# Patient Record
Sex: Female | Born: 1940 | Race: White | Hispanic: No | Marital: Married | Smoking: Former smoker
Health system: Southern US, Community
[De-identification: ages and names within clinical notes are randomized; demographics above are authoritative.]

## PROBLEM LIST (undated history)

## (undated) DIAGNOSIS — M539 Dorsopathy, unspecified: Secondary | ICD-10-CM

## (undated) DIAGNOSIS — C459 Mesothelioma, unspecified: Secondary | ICD-10-CM

## (undated) DIAGNOSIS — I2699 Other pulmonary embolism without acute cor pulmonale: Secondary | ICD-10-CM

## (undated) DIAGNOSIS — D34 Benign neoplasm of thyroid gland: Secondary | ICD-10-CM

## (undated) DIAGNOSIS — F329 Major depressive disorder, single episode, unspecified: Secondary | ICD-10-CM

## (undated) DIAGNOSIS — Z9221 Personal history of antineoplastic chemotherapy: Secondary | ICD-10-CM

## (undated) DIAGNOSIS — G629 Polyneuropathy, unspecified: Secondary | ICD-10-CM

## (undated) DIAGNOSIS — F32A Depression, unspecified: Secondary | ICD-10-CM

## (undated) DIAGNOSIS — I8290 Acute embolism and thrombosis of unspecified vein: Secondary | ICD-10-CM

## (undated) DIAGNOSIS — K219 Gastro-esophageal reflux disease without esophagitis: Secondary | ICD-10-CM

## (undated) HISTORY — PX: VARICOSE VEIN SURGERY: SHX832

## (undated) HISTORY — PX: LUNG BIOPSY: SHX232

## (undated) HISTORY — PX: PORTACATH PLACEMENT: SHX2246

## (undated) HISTORY — PX: ABDOMINAL HYSTERECTOMY: SHX81

---

## 2011-06-02 ENCOUNTER — Ambulatory Visit: Payer: Self-pay | Admitting: Internal Medicine

## 2012-06-07 ENCOUNTER — Ambulatory Visit: Payer: Self-pay | Admitting: Internal Medicine

## 2013-01-21 ENCOUNTER — Ambulatory Visit: Payer: Self-pay | Admitting: Gastroenterology

## 2013-03-07 DIAGNOSIS — C459 Mesothelioma, unspecified: Secondary | ICD-10-CM

## 2013-03-07 HISTORY — DX: Mesothelioma, unspecified: C45.9

## 2013-06-27 ENCOUNTER — Ambulatory Visit: Payer: Self-pay | Admitting: Internal Medicine

## 2013-11-20 ENCOUNTER — Ambulatory Visit: Payer: Self-pay | Admitting: Internal Medicine

## 2013-11-22 ENCOUNTER — Ambulatory Visit: Payer: Self-pay | Admitting: Specialist

## 2013-11-22 LAB — PROTEIN, BODY FLUID: Protein, Body Fluid: 4.7 g/dL

## 2013-11-22 LAB — BODY FLUID CELL COUNT WITH DIFFERENTIAL
BASOS ABS: 0 %
EOS PCT: 0 %
Lymphocytes: 20 %
Neutrophils: 3 %
Nucleated Cell Count: 3150 /mm3
Other Cells BF: 0 %
Other Mononuclear Cells: 77 %

## 2013-11-22 LAB — GLUCOSE, SEROUS FLUID: GLUCOSE, BODY FLUID: 49 mg/dL

## 2013-11-22 LAB — LACTATE DEHYDROGENASE, PLEURAL OR PERITONEAL FLUID: LDH, BODY FLUID: 308 U/L

## 2013-11-26 LAB — BODY FLUID CULTURE

## 2013-11-29 ENCOUNTER — Ambulatory Visit: Payer: Self-pay | Admitting: Cardiothoracic Surgery

## 2013-11-29 LAB — CBC CANCER CENTER
BASOS PCT: 1 %
Basophil #: 0.1 x10 3/mm (ref 0.0–0.1)
EOS ABS: 0.2 x10 3/mm (ref 0.0–0.7)
Eosinophil %: 2.6 %
HCT: 41 % (ref 35.0–47.0)
HGB: 13.1 g/dL (ref 12.0–16.0)
LYMPHS PCT: 14.3 %
Lymphocyte #: 1.2 x10 3/mm (ref 1.0–3.6)
MCH: 28.3 pg (ref 26.0–34.0)
MCHC: 32.1 g/dL (ref 32.0–36.0)
MCV: 88 fL (ref 80–100)
MONO ABS: 0.6 x10 3/mm (ref 0.2–0.9)
Monocyte %: 7.3 %
Neutrophil #: 6 x10 3/mm (ref 1.4–6.5)
Neutrophil %: 74.8 %
PLATELETS: 583 x10 3/mm — AB (ref 150–440)
RBC: 4.65 10*6/uL (ref 3.80–5.20)
RDW: 14.1 % (ref 11.5–14.5)
WBC: 8.1 x10 3/mm (ref 3.6–11.0)

## 2013-11-29 LAB — COMPREHENSIVE METABOLIC PANEL
ALBUMIN: 2.9 g/dL — AB (ref 3.4–5.0)
ALK PHOS: 73 U/L
ALT: 26 U/L
AST: 14 U/L — AB (ref 15–37)
Anion Gap: 6 — ABNORMAL LOW (ref 7–16)
BUN: 12 mg/dL (ref 7–18)
Bilirubin,Total: 0.3 mg/dL (ref 0.2–1.0)
CALCIUM: 8.7 mg/dL (ref 8.5–10.1)
Chloride: 100 mmol/L (ref 98–107)
Co2: 30 mmol/L (ref 21–32)
Creatinine: 0.75 mg/dL (ref 0.60–1.30)
EGFR (African American): >60
EGFR (Non-African Amer.): >60
GLUCOSE: 100 mg/dL — AB (ref 65–99)
Osmolality: 272 (ref 275–301)
Potassium: 4.1 mmol/L (ref 3.5–5.1)
SODIUM: 136 mmol/L (ref 136–145)
TOTAL PROTEIN: 6.7 g/dL (ref 6.4–8.2)

## 2013-11-29 LAB — PROTIME-INR
INR: 1
Prothrombin Time: 13.2 secs (ref 11.5–14.7)

## 2013-11-29 LAB — APTT: Activated PTT: 33.8 secs (ref 23.6–35.9)

## 2013-12-05 ENCOUNTER — Ambulatory Visit: Payer: Self-pay | Admitting: Cardiothoracic Surgery

## 2013-12-13 LAB — CULTURE, FUNGUS WITHOUT SMEAR

## 2014-07-01 ENCOUNTER — Ambulatory Visit
Admit: 2014-07-01 | Disposition: A | Discharge status: Home / Self Care | Payer: Self-pay | Attending: Internal Medicine | Admitting: Internal Medicine

## 2016-04-07 ENCOUNTER — Encounter: Payer: Self-pay | Admitting: *Deleted

## 2016-04-07 NOTE — Discharge Instructions (Signed)
Cataract Surgery, Care After °Refer to this sheet in the next few weeks. These instructions provide you with information about caring for yourself after your procedure. Your health care provider may also give you more specific instructions. Your treatment has been planned according to current medical practices, but problems sometimes occur. Call your health care provider if you have any problems or questions after your procedure. °What can I expect after the procedure? °After the procedure, it is common to have: °· Itching. °· Discomfort. °· Fluid discharge. °· Sensitivity to light and to touch. °· Bruising. °Follow these instructions at home: °Eye Care  °· Check your eye every day for signs of infection. Watch for: °¨ Redness, swelling, or pain. °¨ Fluid, blood, or pus. °¨ Warmth. °¨ Bad smell. °Activity  °· Avoid strenuous activities, such as playing contact sports, for as long as told by your health care provider. °· Do not drive or operate heavy machinery until your health care provider approves. °· Do not bend or lift heavy objects . Bending increases pressure in the eye. You can walk, climb stairs, and do light household chores. °· Ask your health care provider when you can return to work. If you work in a dusty environment, you may be advised to wear protective eyewear for a period of time. °General instructions  °· Take or apply over-the-counter and prescription medicines only as told by your health care provider. This includes eye drops. °· Do not touch or rub your eyes. °· If you were given a protective shield, wear it as told by your health care provider. If you were not given a protective shield, wear sunglasses as told by your health care provider to protect your eyes. °· Keep the area around your eye clean and dry. Avoid swimming or allowing water to hit you directly in the face while showering until told by your health care provider. Keep soap and shampoo out of your eyes. °· Do not put a contact lens  into the affected eye or eyes until your health care provider approves. °· Keep all follow-up visits as told by your health care provider. This is important. °Contact a health care provider if: ° °· You have increased bruising around your eye. °· You have pain that is not helped with medicine. °· You have a fever. °· You have redness, swelling, or pain in your eye. °· You have fluid, blood, or pus coming from your incision. °· Your vision gets worse. °Get help right away if: °· You have sudden vision loss. °This information is not intended to replace advice given to you by your health care provider. Make sure you discuss any questions you have with your health care provider. °Document Released: 09/10/2004 Document Revised: 07/02/2015 Document Reviewed: 01/01/2015 °Elsevier Interactive Patient Education © 2017 [employer] ° ° ° ° °General Anesthesia, Adult, Care After °These instructions provide you with information about caring for yourself after your procedure. Your health care provider may also give you more specific instructions. Your treatment has been planned according to current medical practices, but problems sometimes occur. Call your health care provider if you have any problems or questions after your procedure. °What can I expect after the procedure? °After the procedure, it is common to have: °· Vomiting. °· A sore throat. °· Mental slowness. °It is common to feel: °· Nauseous. °· Cold or shivery. °· Sleepy. °· Tired. °· Sore or achy, even in parts of your body where you did not have surgery. °Follow these instructions at   home: °For at least 24 hours after the procedure:  °· Do not: °¨ Participate in activities where you could fall or become injured. °¨ Drive. °¨ Use heavy machinery. °¨ Drink alcohol. °¨ Take sleeping pills or medicines that cause drowsiness. °¨ Make important decisions or sign legal documents. °¨ Take care of children on your own. °· Rest. °Eating and drinking  °· If you vomit, drink  water, juice, or soup when you can drink without vomiting. °· Drink enough fluid to keep your urine clear or pale yellow. °· Make sure you have little or no nausea before eating solid foods. °· Follow the diet recommended by your health care provider. °General instructions  °· Have a responsible adult stay with you until you are awake and alert. °· Return to your normal activities as told by your health care provider. Ask your health care provider what activities are safe for you. °· Take over-the-counter and prescription medicines only as told by your health care provider. °· If you smoke, do not smoke without supervision. °· Keep all follow-up visits as told by your health care provider. This is important. °Contact a health care provider if: °· You continue to have nausea or vomiting at home, and medicines are not helpful. °· You cannot drink fluids or start eating again. °· You cannot urinate after 8-12 hours. °· You develop a skin rash. °· You have fever. °· You have increasing redness at the site of your procedure. °Get help right away if: °· You have difficulty breathing. °· You have chest pain. °· You have unexpected bleeding. °· You feel that you are having a life-threatening or urgent problem. °This information is not intended to replace advice given to you by your health care provider. Make sure you discuss any questions you have with your health care provider. °Document Released: 05/30/2000 Document Revised: 07/27/2015 Document Reviewed: 02/05/2015 °Elsevier Interactive Patient Education © 2017 [employer] ° °

## 2016-04-13 ENCOUNTER — Ambulatory Visit: Payer: Medicare Other | Admitting: Anesthesiology

## 2016-04-13 ENCOUNTER — Encounter
Admission: RE | Disposition: A | Discharge status: Home / Self Care | Payer: Self-pay | Source: Ambulatory Visit | Attending: Ophthalmology

## 2016-04-13 ENCOUNTER — Ambulatory Visit
Admission: RE | Admit: 2016-04-13 | Discharge: 2016-04-13 | Disposition: A | Discharge status: Home / Self Care | Payer: Medicare Other | Source: Ambulatory Visit | Attending: Ophthalmology | Admitting: Ophthalmology

## 2016-04-13 DIAGNOSIS — Z87891 Personal history of nicotine dependence: Secondary | ICD-10-CM | POA: Insufficient documentation

## 2016-04-13 DIAGNOSIS — H2512 Age-related nuclear cataract, left eye: Secondary | ICD-10-CM | POA: Diagnosis present

## 2016-04-13 DIAGNOSIS — K219 Gastro-esophageal reflux disease without esophagitis: Secondary | ICD-10-CM | POA: Insufficient documentation

## 2016-04-13 HISTORY — PX: CATARACT EXTRACTION W/PHACO: SHX586

## 2016-04-13 HISTORY — DX: Gastro-esophageal reflux disease without esophagitis: K21.9

## 2016-04-13 HISTORY — DX: Benign neoplasm of thyroid gland: D34

## 2016-04-13 HISTORY — DX: Dorsopathy, unspecified: M53.9

## 2016-04-13 HISTORY — DX: Depression, unspecified: F32.A

## 2016-04-13 HISTORY — DX: Mesothelioma, unspecified: C45.9

## 2016-04-13 HISTORY — DX: Acute embolism and thrombosis of unspecified vein: I82.90

## 2016-04-13 HISTORY — DX: Polyneuropathy, unspecified: G62.9

## 2016-04-13 HISTORY — DX: Major depressive disorder, single episode, unspecified: F32.9

## 2016-04-13 SURGERY — PHACOEMULSIFICATION, CATARACT, WITH IOL INSERTION
Anesthesia: Monitor Anesthesia Care | Laterality: Left | Wound class: Clean

## 2016-04-13 MED ORDER — ACETAMINOPHEN 160 MG/5ML PO SOLN: 325.0000 mg | ORAL | Status: DC | PRN

## 2016-04-13 MED ORDER — EPINEPHRINE PF 1 MG/ML IJ SOLN
INTRAMUSCULAR | Status: DC | PRN
  Administered 2016-04-13: 75 mL via OPHTHALMIC

## 2016-04-13 MED ORDER — BRIMONIDINE TARTRATE-TIMOLOL 0.2-0.5 % OP SOLN
OPHTHALMIC | Status: DC | PRN
Start: 2016-04-13 — End: 2016-04-13
  Administered 2016-04-13: 1 [drp] via OPHTHALMIC

## 2016-04-13 MED ORDER — MOXIFLOXACIN HCL 0.5 % OP SOLN
OPHTHALMIC | Status: DC | PRN
  Administered 2016-04-13: 0.2 mL via OPHTHALMIC

## 2016-04-13 MED ORDER — MIDAZOLAM HCL 2 MG/2ML IJ SOLN
INTRAMUSCULAR | Status: DC | PRN
  Administered 2016-04-13: 2 mg via INTRAVENOUS

## 2016-04-13 MED ORDER — NA HYALUR & NA CHOND-NA HYALUR 0.4-0.35 ML IO KIT
PACK | INTRAOCULAR | Status: DC | PRN
  Administered 2016-04-13: 1 mL via INTRAOCULAR

## 2016-04-13 MED ORDER — FENTANYL CITRATE (PF) 100 MCG/2ML IJ SOLN
INTRAMUSCULAR | Status: DC | PRN
  Administered 2016-04-13: 50 ug via INTRAVENOUS

## 2016-04-13 MED ORDER — LACTATED RINGERS IV SOLN: INTRAVENOUS | Status: DC

## 2016-04-13 MED ORDER — ACETAMINOPHEN 325 MG PO TABS: 325.0000 mg | ORAL_TABLET | ORAL | Status: DC | PRN

## 2016-04-13 MED ORDER — LIDOCAINE HCL (PF) 2 % IJ SOLN
INTRAMUSCULAR | Status: DC | PRN
  Administered 2016-04-13: 1 mL

## 2016-04-13 MED ORDER — MOXIFLOXACIN HCL 0.5 % OP SOLN
1.0000 [drp] | OPHTHALMIC | Status: DC | PRN
  Administered 2016-04-13 (×3): 1 [drp] via OPHTHALMIC

## 2016-04-13 MED ORDER — ARMC OPHTHALMIC DILATING DROPS
1.0000 "application " | OPHTHALMIC | Status: DC | PRN
  Administered 2016-04-13 (×3): 1 via OPHTHALMIC

## 2016-04-13 SURGICAL SUPPLY — 25 items
CANNULA ANT/CHMB 27GA (MISCELLANEOUS) ×3 IMPLANT
CARTRIDGE ABBOTT (MISCELLANEOUS) IMPLANT
GLOVE SURG LX 7.5 STRW (GLOVE) ×2
GLOVE SURG LX STRL 7.5 STRW (GLOVE) ×1 IMPLANT
GLOVE SURG TRIUMPH 8.0 PF LTX (GLOVE) ×3 IMPLANT
GOWN STRL REUS W/ TWL LRG LVL3 (GOWN DISPOSABLE) ×2 IMPLANT
GOWN STRL REUS W/TWL LRG LVL3 (GOWN DISPOSABLE) ×4
LENS IOL TECNIS ITEC 21.5 (Intraocular Lens) ×3 IMPLANT
MARKER SKIN DUAL TIP RULER LAB (MISCELLANEOUS) ×3 IMPLANT
NDL RETROBULBAR .5 NSTRL (NEEDLE) IMPLANT
NEEDLE FILTER BLUNT 18X 1/2SAF (NEEDLE) ×2
NEEDLE FILTER BLUNT 18X1 1/2 (NEEDLE) ×1 IMPLANT
PACK CATARACT BRASINGTON (MISCELLANEOUS) ×3 IMPLANT
PACK EYE AFTER SURG (MISCELLANEOUS) ×3 IMPLANT
PACK OPTHALMIC (MISCELLANEOUS) ×3 IMPLANT
RING MALYGIN 7.0 (MISCELLANEOUS) IMPLANT
SUT ETHILON 10-0 CS-B-6CS-B-6 (SUTURE)
SUT VICRYL  9 0 (SUTURE)
SUT VICRYL 9 0 (SUTURE) IMPLANT
SUTURE EHLN 10-0 CS-B-6CS-B-6 (SUTURE) IMPLANT
SYR 3ML LL SCALE MARK (SYRINGE) ×3 IMPLANT
SYR 5ML LL (SYRINGE) ×3 IMPLANT
SYR TB 1ML LUER SLIP (SYRINGE) ×3 IMPLANT
WATER STERILE IRR 250ML POUR (IV SOLUTION) ×3 IMPLANT
WIPE NON LINTING 3.25X3.25 (MISCELLANEOUS) ×3 IMPLANT

## 2016-04-13 NOTE — Anesthesia Preprocedure Evaluation (Signed)
Anesthesia Evaluation  Patient identified by MRN, date of birth, ID band Patient awake    Reviewed: Allergy & Precautions, H&P , NPO status , Patient's Chart, lab work & pertinent test results  Airway Mallampati: II  TM Distance: >3 FB Neck ROM: full    Dental no notable dental hx.    Pulmonary former smoker,    Pulmonary exam normal        Cardiovascular Normal cardiovascular exam     Neuro/Psych PSYCHIATRIC DISORDERS    GI/Hepatic GERD  ,  Endo/Other    Renal/GU      Musculoskeletal   Abdominal   Peds  Hematology   Anesthesia Other Findings   Reproductive/Obstetrics                             Anesthesia Physical Anesthesia Plan  ASA: II  Anesthesia Plan: MAC   Post-op Pain Management:    Induction:   Airway Management Planned:   Additional Equipment:   Intra-op Plan:   Post-operative Plan:   Informed Consent: I have reviewed the patients History and Physical, chart, labs and discussed the procedure including the risks, benefits and alternatives for the proposed anesthesia with the patient or authorized representative who has indicated his/her understanding and acceptance.     Plan Discussed with:   Anesthesia Plan Comments:         Anesthesia Quick Evaluation

## 2016-04-13 NOTE — Anesthesia Postprocedure Evaluation (Signed)
Anesthesia Post Note  Patient: Melanie Page  Procedure(s) Performed: Procedure(s) (LRB): CATARACT EXTRACTION PHACO AND INTRAOCULAR LENS PLACEMENT (IOC)  Left eye (Left)  Patient location during evaluation: PACU Anesthesia Type: MAC Level of consciousness: awake and alert and oriented Pain management: satisfactory to patient Vital Signs Assessment: post-procedure vital signs reviewed and stable Respiratory status: spontaneous breathing, nonlabored ventilation and respiratory function stable Cardiovascular status: blood pressure returned to baseline and stable Postop Assessment: Adequate PO intake and No signs of nausea or vomiting Anesthetic complications: no    Raliegh Ip

## 2016-04-13 NOTE — H&P (Signed)
The History and Physical notes are on paper, have been signed, and are to be scanned. The patient remains stable and unchanged from the H&P.   Previous H&P reviewed, patient examined, and there are no changes.  Melanie Page 04/13/2016 8:38 AM

## 2016-04-13 NOTE — Transfer of Care (Signed)
Immediate Anesthesia Transfer of Care Note  Patient: Melanie Page  Procedure(s) Performed: Procedure(s) with comments: CATARACT EXTRACTION PHACO AND INTRAOCULAR LENS PLACEMENT (IOC)  Left eye (Left) - left eye IVA Topicl  Patient Location: PACU  Anesthesia Type: MAC  Level of Consciousness: awake, alert  and patient cooperative  Airway and Oxygen Therapy: Patient Spontanous Breathing and Patient connected to supplemental oxygen  Post-op Assessment: Post-op Vital signs reviewed, Patient's Cardiovascular Status Stable, Respiratory Function Stable, Patent Airway and No signs of Nausea or vomiting  Post-op Vital Signs: Reviewed and stable  Complications: No apparent anesthesia complications

## 2016-04-13 NOTE — Anesthesia Procedure Notes (Signed)
Procedure Name: MAC Performed by: Mayme Genta Pre-anesthesia Checklist: Patient identified, Emergency Drugs available, Suction available, Timeout performed and Patient being monitored Patient Re-evaluated:Patient Re-evaluated prior to inductionOxygen Delivery Method: Nasal cannula Placement Confirmation: positive ETCO2

## 2016-04-13 NOTE — Op Note (Signed)
OPERATIVE NOTE  Melanie Page [id number] 04/13/2016   PREOPERATIVE DIAGNOSIS:  Nuclear sclerotic cataract left eye. H25.12   POSTOPERATIVE DIAGNOSIS:    Nuclear sclerotic cataract left eye.     PROCEDURE:  Phacoemusification with posterior chamber intraocular lens placement of the left eye   LENS:   Implant Name Type Inv. Item Serial No. Manufacturer Lot No. LRB No. Used  LENS IOL DIOP 21.5 - [id number] Intraocular Lens LENS IOL DIOP 21.5 [id number]   Left 1        ULTRASOUND TIME: 23  % of 1 minutes 20 seconds, CDE 19.0  SURGEON:  Wyonia Hough, MD   ANESTHESIA:  Topical with tetracaine drops and 2% Xylocaine jelly, augmented with 1% preservative-free intracameral lidocaine.    COMPLICATIONS:  None.   DESCRIPTION OF PROCEDURE:  The patient was identified in the holding room and transported to the operating room and placed in the supine position under the operating microscope.  The left eye was identified as the operative eye and it was prepped and draped in the usual sterile ophthalmic fashion.   A 1 millimeter clear-corneal paracentesis was made at the 1:30 position.  0.5 ml of preservative-free 1% lidocaine was injected into the anterior chamber.  The anterior chamber was filled with Viscoat viscoelastic.  A 2.4 millimeter keratome was used to make a near-clear corneal incision at the 10:30 position.  .  A curvilinear capsulorrhexis was made with a cystotome and capsulorrhexis forceps.  Balanced salt solution was used to hydrodissect and hydrodelineate the nucleus.   Phacoemulsification was then used in stop and chop fashion to remove the lens nucleus and epinucleus.  The remaining cortex was then removed using the irrigation and aspiration handpiece. Provisc was then placed into the capsular bag to distend it for lens placement.  A lens was then injected into the capsular bag.  The remaining viscoelastic was aspirated.   Wounds were hydrated with balanced salt  solution.  The anterior chamber was inflated to a physiologic pressure with balanced salt solution.  No wound leaks were noted. Vigamox 0.2 ml of a 1mg  per ml solution was injected into the anterior chamber for a dose of 0.2 mg of intracameral antibiotic at the completion of the case.   Timolol and Brimonidine drops were applied to the eye.  The patient was taken to the recovery room in stable condition without complications of anesthesia or surgery.  [name] 04/13/2016, 9:06 AM

## 2016-04-14 ENCOUNTER — Encounter: Payer: Self-pay | Admitting: Ophthalmology

## 2016-05-11 ENCOUNTER — Encounter: Payer: Self-pay | Admitting: *Deleted

## 2016-05-13 NOTE — Discharge Instructions (Signed)
Cataract Surgery, Care After °Refer to this sheet in the next few weeks. These instructions provide you with information about caring for yourself after your procedure. Your health care provider may also give you more specific instructions. Your treatment has been planned according to current medical practices, but problems sometimes occur. Call your health care provider if you have any problems or questions after your procedure. °What can I expect after the procedure? °After the procedure, it is common to have: °· Itching. °· Discomfort. °· Fluid discharge. °· Sensitivity to light and to touch. °· Bruising. °Follow these instructions at home: °Eye Care  °· Check your eye every day for signs of infection. Watch for: °¨ Redness, swelling, or pain. °¨ Fluid, blood, or pus. °¨ Warmth. °¨ Bad smell. °Activity  °· Avoid strenuous activities, such as playing contact sports, for as long as told by your health care provider. °· Do not drive or operate heavy machinery until your health care provider approves. °· Do not bend or lift heavy objects . Bending increases pressure in the eye. You can walk, climb stairs, and do light household chores. °· Ask your health care provider when you can return to work. If you work in a dusty environment, you may be advised to wear protective eyewear for a period of time. °General instructions  °· Take or apply over-the-counter and prescription medicines only as told by your health care provider. This includes eye drops. °· Do not touch or rub your eyes. °· If you were given a protective shield, wear it as told by your health care provider. If you were not given a protective shield, wear sunglasses as told by your health care provider to protect your eyes. °· Keep the area around your eye clean and dry. Avoid swimming or allowing water to hit you directly in the face while showering until told by your health care provider. Keep soap and shampoo out of your eyes. °· Do not put a contact lens  into the affected eye or eyes until your health care provider approves. °· Keep all follow-up visits as told by your health care provider. This is important. °Contact a health care provider if: ° °· You have increased bruising around your eye. °· You have pain that is not helped with medicine. °· You have a fever. °· You have redness, swelling, or pain in your eye. °· You have fluid, blood, or pus coming from your incision. °· Your vision gets worse. °Get help right away if: °· You have sudden vision loss. °This information is not intended to replace advice given to you by your health care provider. Make sure you discuss any questions you have with your health care provider. °Document Released: 09/10/2004 Document Revised: 07/02/2015 Document Reviewed: 01/01/2015 °Elsevier Interactive Patient Education © 2017 [employer] ° ° ° ° °General Anesthesia, Adult, Care After °These instructions provide you with information about caring for yourself after your procedure. Your health care provider may also give you more specific instructions. Your treatment has been planned according to current medical practices, but problems sometimes occur. Call your health care provider if you have any problems or questions after your procedure. °What can I expect after the procedure? °After the procedure, it is common to have: °· Vomiting. °· A sore throat. °· Mental slowness. °It is common to feel: °· Nauseous. °· Cold or shivery. °· Sleepy. °· Tired. °· Sore or achy, even in parts of your body where you did not have surgery. °Follow these instructions at   home: °For at least 24 hours after the procedure:  °· Do not: °¨ Participate in activities where you could fall or become injured. °¨ Drive. °¨ Use heavy machinery. °¨ Drink alcohol. °¨ Take sleeping pills or medicines that cause drowsiness. °¨ Make important decisions or sign legal documents. °¨ Take care of children on your own. °· Rest. °Eating and drinking  °· If you vomit, drink  water, juice, or soup when you can drink without vomiting. °· Drink enough fluid to keep your urine clear or pale yellow. °· Make sure you have little or no nausea before eating solid foods. °· Follow the diet recommended by your health care provider. °General instructions  °· Have a responsible adult stay with you until you are awake and alert. °· Return to your normal activities as told by your health care provider. Ask your health care provider what activities are safe for you. °· Take over-the-counter and prescription medicines only as told by your health care provider. °· If you smoke, do not smoke without supervision. °· Keep all follow-up visits as told by your health care provider. This is important. °Contact a health care provider if: °· You continue to have nausea or vomiting at home, and medicines are not helpful. °· You cannot drink fluids or start eating again. °· You cannot urinate after 8-12 hours. °· You develop a skin rash. °· You have fever. °· You have increasing redness at the site of your procedure. °Get help right away if: °· You have difficulty breathing. °· You have chest pain. °· You have unexpected bleeding. °· You feel that you are having a life-threatening or urgent problem. °This information is not intended to replace advice given to you by your health care provider. Make sure you discuss any questions you have with your health care provider. °Document Released: 05/30/2000 Document Revised: 07/27/2015 Document Reviewed: 02/05/2015 °Elsevier Interactive Patient Education © 2017 [employer] ° °

## 2016-06-08 ENCOUNTER — Encounter: Payer: Self-pay | Admitting: *Deleted

## 2016-06-15 ENCOUNTER — Ambulatory Visit
Admission: RE | Admit: 2016-06-15 | Discharge: 2016-06-15 | Disposition: A | Discharge status: Home / Self Care | Payer: Medicare Other | Source: Ambulatory Visit | Attending: Ophthalmology | Admitting: Ophthalmology

## 2016-06-15 ENCOUNTER — Ambulatory Visit: Payer: Medicare Other | Admitting: Anesthesiology

## 2016-06-15 ENCOUNTER — Encounter
Admission: RE | Disposition: A | Discharge status: Home / Self Care | Payer: Self-pay | Source: Ambulatory Visit | Attending: Ophthalmology

## 2016-06-15 DIAGNOSIS — Z87891 Personal history of nicotine dependence: Secondary | ICD-10-CM | POA: Insufficient documentation

## 2016-06-15 DIAGNOSIS — K219 Gastro-esophageal reflux disease without esophagitis: Secondary | ICD-10-CM | POA: Insufficient documentation

## 2016-06-15 DIAGNOSIS — G629 Polyneuropathy, unspecified: Secondary | ICD-10-CM | POA: Insufficient documentation

## 2016-06-15 DIAGNOSIS — H2511 Age-related nuclear cataract, right eye: Secondary | ICD-10-CM | POA: Diagnosis present

## 2016-06-15 DIAGNOSIS — F329 Major depressive disorder, single episode, unspecified: Secondary | ICD-10-CM | POA: Diagnosis not present

## 2016-06-15 DIAGNOSIS — Z79899 Other long term (current) drug therapy: Secondary | ICD-10-CM | POA: Insufficient documentation

## 2016-06-15 HISTORY — PX: CATARACT EXTRACTION W/PHACO: SHX586

## 2016-06-15 SURGERY — PHACOEMULSIFICATION, CATARACT, WITH IOL INSERTION
Anesthesia: Monitor Anesthesia Care | Laterality: Right | Wound class: Clean

## 2016-06-15 MED ORDER — EPINEPHRINE PF 1 MG/ML IJ SOLN
INTRAOCULAR | Status: DC | PRN
  Administered 2016-06-15: 61 mL via OPHTHALMIC

## 2016-06-15 MED ORDER — ARMC OPHTHALMIC DILATING DROPS
1.0000 "application " | OPHTHALMIC | Status: DC | PRN
  Administered 2016-06-15 (×2): 1 via OPHTHALMIC

## 2016-06-15 MED ORDER — MIDAZOLAM HCL 2 MG/2ML IJ SOLN
INTRAMUSCULAR | Status: DC | PRN
Start: 2016-06-15 — End: 2016-06-15
  Administered 2016-06-15: 2 mg via INTRAVENOUS

## 2016-06-15 MED ORDER — MOXIFLOXACIN HCL 0.5 % OP SOLN
1.0000 [drp] | OPHTHALMIC | Status: DC | PRN
  Administered 2016-06-15 (×3): 1 [drp] via OPHTHALMIC

## 2016-06-15 MED ORDER — FENTANYL CITRATE (PF) 100 MCG/2ML IJ SOLN
INTRAMUSCULAR | Status: DC | PRN
  Administered 2016-06-15: 50 ug via INTRAVENOUS

## 2016-06-15 MED ORDER — MOXIFLOXACIN HCL 0.5 % OP SOLN
OPHTHALMIC | Status: DC | PRN
  Administered 2016-06-15: 0.4 mL via OPHTHALMIC

## 2016-06-15 MED ORDER — LIDOCAINE HCL (PF) 2 % IJ SOLN
INTRAOCULAR | Status: DC | PRN
  Administered 2016-06-15: 1 mL via INTRAOCULAR

## 2016-06-15 MED ORDER — BRIMONIDINE TARTRATE-TIMOLOL 0.2-0.5 % OP SOLN
OPHTHALMIC | Status: DC | PRN
  Administered 2016-06-15: 1 [drp] via OPHTHALMIC

## 2016-06-15 MED ORDER — NA HYALUR & NA CHOND-NA HYALUR 0.4-0.35 ML IO KIT
PACK | INTRAOCULAR | Status: DC | PRN
  Administered 2016-06-15: 1 mL via INTRAOCULAR

## 2016-06-15 SURGICAL SUPPLY — 25 items
CANNULA ANT/CHMB 27GA (MISCELLANEOUS) ×3 IMPLANT
CARTRIDGE ABBOTT (MISCELLANEOUS) IMPLANT
GLOVE SURG LX 7.5 STRW (GLOVE) ×2
GLOVE SURG LX STRL 7.5 STRW (GLOVE) ×1 IMPLANT
GLOVE SURG TRIUMPH 8.0 PF LTX (GLOVE) ×3 IMPLANT
GOWN STRL REUS W/ TWL LRG LVL3 (GOWN DISPOSABLE) ×2 IMPLANT
GOWN STRL REUS W/TWL LRG LVL3 (GOWN DISPOSABLE) ×4
LENS IOL TECNIS ITEC 21.5 (Intraocular Lens) ×3 IMPLANT
MARKER SKIN DUAL TIP RULER LAB (MISCELLANEOUS) ×3 IMPLANT
NDL RETROBULBAR .5 NSTRL (NEEDLE) IMPLANT
NEEDLE FILTER BLUNT 18X 1/2SAF (NEEDLE) ×2
NEEDLE FILTER BLUNT 18X1 1/2 (NEEDLE) ×1 IMPLANT
PACK CATARACT BRASINGTON (MISCELLANEOUS) ×3 IMPLANT
PACK EYE AFTER SURG (MISCELLANEOUS) IMPLANT
PACK OPTHALMIC (MISCELLANEOUS) ×3 IMPLANT
RING MALYGIN 7.0 (MISCELLANEOUS) IMPLANT
SUT ETHILON 10-0 CS-B-6CS-B-6 (SUTURE)
SUT VICRYL  9 0 (SUTURE)
SUT VICRYL 9 0 (SUTURE) IMPLANT
SUTURE EHLN 10-0 CS-B-6CS-B-6 (SUTURE) IMPLANT
SYR 3ML LL SCALE MARK (SYRINGE) ×3 IMPLANT
SYR 5ML LL (SYRINGE) ×3 IMPLANT
SYR TB 1ML LUER SLIP (SYRINGE) ×3 IMPLANT
WATER STERILE IRR 250ML POUR (IV SOLUTION) ×3 IMPLANT
WIPE NON LINTING 3.25X3.25 (MISCELLANEOUS) ×3 IMPLANT

## 2016-06-15 NOTE — Transfer of Care (Signed)
Immediate Anesthesia Transfer of Care Note  Patient: Melanie Page  Procedure(s) Performed: Procedure(s): CATARACT EXTRACTION PHACO AND INTRAOCULAR LENS PLACEMENT (IOC)  right (Right)  Patient Location: PACU  Anesthesia Type: MAC  Level of Consciousness: awake, alert  and patient cooperative  Airway and Oxygen Therapy: Patient Spontanous Breathing and Patient connected to supplemental oxygen  Post-op Assessment: Post-op Vital signs reviewed, Patient's Cardiovascular Status Stable, Respiratory Function Stable, Patent Airway and No signs of Nausea or vomiting  Post-op Vital Signs: Reviewed and stable  Complications: No apparent anesthesia complications

## 2016-06-15 NOTE — Anesthesia Postprocedure Evaluation (Addendum)
Anesthesia Post Note  Patient: Melanie Page  Procedure(s) Performed: Procedure(s) (LRB): CATARACT EXTRACTION PHACO AND INTRAOCULAR LENS PLACEMENT (IOC)  right (Right)  Patient location during evaluation: PACU Anesthesia Type: MAC Level of consciousness: awake Pain management: pain level controlled Vital Signs Assessment: post-procedure vital signs reviewed and stable Respiratory status: spontaneous breathing Cardiovascular status: blood pressure returned to baseline Postop Assessment: no headache Anesthetic complications: no    Jaci Standard, III,  Jacquel Redditt D

## 2016-06-15 NOTE — Op Note (Signed)
LOCATION:  [facility]   PREOPERATIVE DIAGNOSIS:    Nuclear sclerotic cataract right eye. H25.11   POSTOPERATIVE DIAGNOSIS:  Nuclear sclerotic cataract right eye.     PROCEDURE:  Phacoemusification with posterior chamber intraocular lens placement of the right eye   LENS:   Implant Name Type Inv. Item Serial No. Manufacturer Lot No. LRB No. Used  LENS IOL DIOP 21.5 - [id number] Intraocular Lens LENS IOL DIOP 21.5 [id number]   Right 1        ULTRASOUND TIME: 19 % of 1 minutes, 30 seconds.  CDE 17.4   SURGEON:  Wyonia Hough, MD   ANESTHESIA:  Topical with tetracaine drops and 2% Xylocaine jelly, augmented with 1% preservative-free intracameral lidocaine.    COMPLICATIONS:  None.   DESCRIPTION OF PROCEDURE:  The patient was identified in the holding room and transported to the operating room and placed in the supine position under the operating microscope.  The right eye was identified as the operative eye and it was prepped and draped in the usual sterile ophthalmic fashion.   A 1 millimeter clear-corneal paracentesis was made at the 12:00 position.  0.5 ml of preservative-free 1% lidocaine was injected into the anterior chamber. The anterior chamber was filled with Viscoat viscoelastic.  A 2.4 millimeter keratome was used to make a near-clear corneal incision at the 9:00 position.  A curvilinear capsulorrhexis was made with a cystotome and capsulorrhexis forceps.  Balanced salt solution was used to hydrodissect and hydrodelineate the nucleus.   Phacoemulsification was then used in stop and chop fashion to remove the lens nucleus and epinucleus.  The remaining cortex was then removed using the irrigation and aspiration handpiece. Provisc was then placed into the capsular bag to distend it for lens placement.  A lens was then injected into the capsular bag.  The remaining viscoelastic was aspirated.   Wounds were hydrated with balanced salt solution.  The anterior  chamber was inflated to a physiologic pressure with balanced salt solution.  No wound leaks were noted. Vigamox 0.2 ml of a 1mg  per ml solution was injected into the anterior chamber for a dose of 0.2 mg of intracameral antibiotic at the completion of the case.   Timolol and Brimonidine drops were applied to the eye.  The patient was taken to the recovery room in stable condition without complications of anesthesia or surgery.   Melanie Page 06/15/2016, 10:09 AM

## 2016-06-15 NOTE — H&P (Signed)
The History and Physical notes are on paper, have been signed, and are to be scanned. The patient remains stable and unchanged from the H&P.   Previous H&P reviewed, patient examined, and there are no changes.  Melanie Page 06/15/2016 9:07 AM

## 2016-06-15 NOTE — Anesthesia Preprocedure Evaluation (Signed)
Anesthesia Evaluation  Patient identified by MRN, date of birth, ID band Patient awake    Reviewed: Allergy & Precautions, H&P , NPO status , Patient's Chart, lab work & pertinent test results  Airway Mallampati: II  TM Distance: >3 FB Neck ROM: full    Dental no notable dental hx.    Pulmonary former smoker,    Pulmonary exam normal        Cardiovascular negative cardio ROS Normal cardiovascular exam     Neuro/Psych    GI/Hepatic Neg liver ROS, Medicated,  Endo/Other  negative endocrine ROS  Renal/GU negative Renal ROS     Musculoskeletal   Abdominal   Peds  Hematology negative hematology ROS (+)   Anesthesia Other Findings   Reproductive/Obstetrics negative OB ROS                             Anesthesia Physical Anesthesia Plan  ASA: II  Anesthesia Plan: MAC   Post-op Pain Management:    Induction:   Airway Management Planned:   Additional Equipment:   Intra-op Plan:   Post-operative Plan:   Informed Consent: I have reviewed the patients History and Physical, chart, labs and discussed the procedure including the risks, benefits and alternatives for the proposed anesthesia with the patient or authorized representative who has indicated his/her understanding and acceptance.     Plan Discussed with:   Anesthesia Plan Comments:         Anesthesia Quick Evaluation

## 2016-06-15 NOTE — Anesthesia Procedure Notes (Signed)
Procedure Name: MAC Date/Time: 06/15/2016 9:45 AM Performed by: Janna Arch Pre-anesthesia Checklist: Patient identified, Emergency Drugs available, Suction available and Patient being monitored Patient Re-evaluated:Patient Re-evaluated prior to inductionOxygen Delivery Method: Nasal cannula

## 2016-06-16 ENCOUNTER — Encounter: Payer: Self-pay | Admitting: Ophthalmology

## 2016-07-11 ENCOUNTER — Other Ambulatory Visit: Payer: Self-pay | Admitting: Internal Medicine

## 2016-07-11 DIAGNOSIS — Z1231 Encounter for screening mammogram for malignant neoplasm of breast: Secondary | ICD-10-CM

## 2016-07-28 ENCOUNTER — Ambulatory Visit
Admission: RE | Admit: 2016-07-28 | Discharge: 2016-07-28 | Disposition: A | Discharge status: Home / Self Care | Payer: Medicare Other | Source: Ambulatory Visit | Attending: Internal Medicine | Admitting: Internal Medicine

## 2016-07-28 DIAGNOSIS — Z1231 Encounter for screening mammogram for malignant neoplasm of breast: Secondary | ICD-10-CM

## 2016-07-28 HISTORY — DX: Personal history of antineoplastic chemotherapy: Z92.21

## 2016-12-18 ENCOUNTER — Ambulatory Visit
Admission: EM | Admit: 2016-12-18 | Discharge: 2016-12-18 | Disposition: A | Discharge status: Home / Self Care | Payer: Medicare Other | Attending: Emergency Medicine | Admitting: Emergency Medicine

## 2016-12-18 ENCOUNTER — Ambulatory Visit: Payer: Medicare Other

## 2016-12-18 DIAGNOSIS — M79645 Pain in left finger(s): Secondary | ICD-10-CM | POA: Diagnosis present

## 2016-12-18 DIAGNOSIS — K219 Gastro-esophageal reflux disease without esophagitis: Secondary | ICD-10-CM | POA: Insufficient documentation

## 2016-12-18 DIAGNOSIS — Z859 Personal history of malignant neoplasm, unspecified: Secondary | ICD-10-CM | POA: Insufficient documentation

## 2016-12-18 DIAGNOSIS — M12542 Traumatic arthropathy, left hand: Secondary | ICD-10-CM

## 2016-12-18 DIAGNOSIS — Z87891 Personal history of nicotine dependence: Secondary | ICD-10-CM | POA: Diagnosis not present

## 2016-12-18 DIAGNOSIS — Z79899 Other long term (current) drug therapy: Secondary | ICD-10-CM | POA: Diagnosis not present

## 2016-12-18 DIAGNOSIS — Z86718 Personal history of other venous thrombosis and embolism: Secondary | ICD-10-CM | POA: Diagnosis not present

## 2016-12-18 DIAGNOSIS — W06XXXA Fall from bed, initial encounter: Secondary | ICD-10-CM | POA: Insufficient documentation

## 2016-12-18 DIAGNOSIS — Z9841 Cataract extraction status, right eye: Secondary | ICD-10-CM | POA: Insufficient documentation

## 2016-12-18 DIAGNOSIS — Z9221 Personal history of antineoplastic chemotherapy: Secondary | ICD-10-CM | POA: Insufficient documentation

## 2016-12-18 DIAGNOSIS — Y92003 Bedroom of unspecified non-institutional (private) residence as the place of occurrence of the external cause: Secondary | ICD-10-CM | POA: Diagnosis not present

## 2016-12-18 DIAGNOSIS — Z9842 Cataract extraction status, left eye: Secondary | ICD-10-CM | POA: Insufficient documentation

## 2016-12-18 DIAGNOSIS — S0181XA Laceration without foreign body of other part of head, initial encounter: Secondary | ICD-10-CM

## 2016-12-18 DIAGNOSIS — W228XXA Striking against or struck by other objects, initial encounter: Secondary | ICD-10-CM | POA: Diagnosis not present

## 2016-12-18 DIAGNOSIS — F329 Major depressive disorder, single episode, unspecified: Secondary | ICD-10-CM | POA: Diagnosis not present

## 2016-12-18 DIAGNOSIS — Z961 Presence of intraocular lens: Secondary | ICD-10-CM | POA: Diagnosis not present

## 2016-12-18 DIAGNOSIS — Z91041 Radiographic dye allergy status: Secondary | ICD-10-CM | POA: Insufficient documentation

## 2016-12-18 DIAGNOSIS — W19XXXA Unspecified fall, initial encounter: Secondary | ICD-10-CM | POA: Diagnosis not present

## 2016-12-18 DIAGNOSIS — Z880 Allergy status to penicillin: Secondary | ICD-10-CM | POA: Insufficient documentation

## 2016-12-18 MED ORDER — MUPIROCIN 2 % EX OINT: 1.0000 "application " | TOPICAL_OINTMENT | Freq: Three times a day (TID) | CUTANEOUS | 0 refills | Status: AC

## 2016-12-18 NOTE — Discharge Instructions (Signed)
Apply Bactroban ointment to your chin laceration 3 times daily. Plan on removing sutures in 5 days.Left middle finger gentle range of motion exercises several times each hour to prevent stiffness. Use ice 20 minutes each time 4-5 times daily to decrease pain and swelling. Also elevated above heart as much as feasible.

## 2016-12-18 NOTE — ED Provider Notes (Signed)
[facility]    CSN: [id number] Arrival date & time: 12/18/16  0940     History   Chief Complaint Chief Complaint  Patient presents with  . Facial Laceration  . Hand Pain    HPI Melanie Page is a 76 y.o. female.   HPI  This a 76 year old female who states that 3am this morning she sat up on the side of her bed to get up to the bathroom she fell forward hitting her bedside table. He states they have recently purchased a new mattress which has soft sides and it easily gives way. Her husband has fallen out of bed twice in this manner. In addition she has jammed her left nondominant middle finger over the PIP joint.  ecchymotic swollen and also is painful. She denies any loss of consciousness.    Past Medical History:  Diagnosis Date  . Clot    small blood clot found after start of chemo.  started on enoxaparin  . Depression   . GERD (gastroesophageal reflux disease)   . Mesothelioma ([place]) 2015   had chemo  . Multilevel degenerative disc disease   . 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 Personal history of chemotherapy   . Thyroid adenoma     There are no active problems to display for this patient.   Past Surgical History:  Procedure Laterality Date  . ABDOMINAL HYSTERECTOMY    . CATARACT EXTRACTION W/PHACO Left 04/13/2016   Procedure: CATARACT EXTRACTION PHACO AND INTRAOCULAR LENS PLACEMENT ([place])  Left eye;  Surgeon: Leandrew Koyanagi, MD;  Location: [facility];  Service: Ophthalmology;  Laterality: Left;  left eye IVA Topicl  . CATARACT EXTRACTION W/PHACO Right 06/15/2016   Procedure: CATARACT EXTRACTION PHACO AND INTRAOCULAR LENS PLACEMENT ([place])  right;  Surgeon: Leandrew Koyanagi, MD;  Location: [facility];  Service: Ophthalmology;  Laterality: Right;  . LUNG BIOPSY    . PORTACATH PLACEMENT    . VARICOSE VEIN SURGERY      OB History    No data available       Home Medications    Prior to Admission  medications   Medication Sig Start Date End Date Taking? Authorizing Provider  cabergoline (DOSTINEX) 0.5 MG tablet Take 0.25 mg by mouth 2 (two) times a week. Sunday, Wednesday   Yes [provider]  Cholecalciferol (VITAMIN D-3) 1000 units CAPS Take by mouth daily.   Yes [provider]  DULoxetine (CYMBALTA) 60 MG capsule Take 60 mg by mouth daily.   Yes [provider]  edoxaban (SAVAYSA) 60 MG TABS tablet Take by mouth daily.   Yes [provider]  levothyroxine (SYNTHROID, LEVOTHROID) 75 MCG tablet Take 75 mcg by mouth daily before breakfast.   Yes [provider]  Magnesium 250 MG TABS Take by mouth daily.   Yes [provider]  Multiple Vitamin (MULTIVITAMIN) tablet Take 1 tablet by mouth daily.   Yes [provider]  omeprazole (PRILOSEC) 20 MG capsule Take 20 mg by mouth daily.   Yes [provider]  predniSONE (DELTASONE) 50 MG tablet Take 50 mg by mouth daily with breakfast. 1 tab taken 13 hrs, 7 hrs, and 1 hr prior to scan   Yes [provider]  folic acid (FOLVITE) 1 MG tablet Take 1 mg by mouth daily.    [provider]  loteprednol (LOTEMAX) 0.5 % ophthalmic suspension 2 (two) times daily.    [provider]  mupirocin ointment (BACTROBAN) 2 % Apply 1 application topically 3 (three) times daily. 12/18/16   Lorin Picket, PA-C  Pembrolizumab (KEYTRUDA IV) Inject into the vein.    [provider]  sertraline (ZOLOFT) 25 MG tablet Take 25 mg by mouth daily.    [provider]    Family History Family History  Problem Relation Age of Onset  . Breast cancer Paternal Aunt     Social History Social History  Substance Use Topics  . Smoking status: Former Smoker    Quit date: 03/07/1968  . Smokeless tobacco: Never Used     Comment: social smoker over 40 yrs ago  . Alcohol use 0.6 oz/week    1 Glasses of wine per week     Allergies   Contrast media  [iodinated diagnostic agents] and Penicillins   Review of Systems Review of Systems  Constitutional: Positive for activity change. Negative for appetite change, chills, fatigue and fever.  Musculoskeletal: Positive for arthralgias.  Skin: Positive for wound.  All other systems reviewed and are negative.    Physical Exam Triage Vital Signs ED Triage Vitals [12/18/16 0952]  Enc Vitals Group     BP 93/61     Pulse Rate (!) 105     Resp 18     Temp 97.9 F (36.6 C)     Temp Source Oral     SpO2 96 %     Weight 155 lb (70.3 kg)     Height 5\' 2"  (1.575 m)     Head Circumference      Peak Flow      Pain Score 2     Pain Loc      Pain Edu?      Excl. in [place]?    No data found.   Updated Vital Signs BP 93/61 (BP Location: Left Arm)   Pulse (!) 105   Temp 97.9 F (36.6 C) (Oral)   Resp 18   Ht 5\' 2"  (1.575 m)   Wt 155 lb (70.3 kg)   SpO2 96%   BMI 28.35 kg/m   Visual Acuity Right Eye Distance:   Left Eye Distance:   Bilateral Distance:    Right Eye Near:   Left Eye Near:    Bilateral Near:     Physical Exam  Constitutional: She is oriented to person, place, and time. She appears well-developed and well-nourished. No distress.  HENT:  Head: Normocephalic.  Examination of the chin shows a 3.2 cm laceration obliquely oriented. There is penetration of approximately 2-3 mm. No foreign bodies are identified. She does have ecchymosis surrounding the laceration. Refer to photographs for detail. No deformity felt of the chin structure itself.  Eyes: Pupils are equal, round, and reactive to light. EOM are normal. Right eye exhibits no discharge. Left eye exhibits no discharge.  Neck: Normal range of motion.  Musculoskeletal: She exhibits edema, tenderness and deformity.  Examination of the left nondominant middle finger shows ecchymosis and swelling of the PIP joint. Has no collateral ligament abnormalities. Volar plate appears to be intact. Slip is also intact. FDS FDP are  strong. Extensor tendon is also strong.  Neurological: She is alert and oriented to person, place, and time.  Skin: Skin is warm and dry. She is not diaphoretic.  Psychiatric: She has a normal mood and affect. Her behavior is normal. Judgment and thought content normal.  Nursing note and vitals reviewed.        UC Treatments / Results  Labs (all labs ordered are listed, but only abnormal results are displayed) Labs Reviewed - No data to display  EKG  EKG Interpretation None       Radiology Dg Finger Middle Left  Result Date: 12/18/2016 CLINICAL DATA:  Pain and swelling about the PIP joint of the left long finger due to an injury. Mechanism unknown. Initial encounter. EXAM: LEFT MIDDLE FINGER 2+V COMPARISON:  None. FINDINGS: No fracture, dislocation or radiopaque foreign body. Mild degenerative change about the DIP joint noted. IMPRESSION: No acute abnormality. Electronically Signed   By: Inge Rise M.D.   On: 12/18/2016 11:24    Procedures .Marland KitchenLaceration Repair Date/Time: 12/18/2016 11:03 AM Performed by: Lorin Picket Authorized by: Lorin Picket   Consent:    Consent obtained:  Verbal   Consent given by:  Patient   Risks discussed:  Pain and poor cosmetic result Anesthesia (see MAR for exact dosages):    Anesthesia method:  Local infiltration   Local anesthetic:  Lidocaine 1% w/o epi Laceration details:    Location:  Face   Face location:  Chin   Length (cm):  3.2   Depth (mm):  2 Repair type:    Repair type:  Simple Pre-procedure details:    Preparation:  Patient was prepped and draped in usual sterile fashion Exploration:    Hemostasis achieved with:  Direct pressure   Wound extent: areolar tissue violated     Contaminated: no   Treatment:    Area cleansed with:  Hibiclens   Amount of cleaning:  Standard   Irrigation solution:  Sterile water   Irrigation volume:  15   Visualized foreign bodies/material removed: no   Skin repair:     Repair method:  Sutures Approximation:    Approximation:  Close   Vermilion border: well-aligned   Post-procedure details:    Dressing:  Open (no dressing)   Patient tolerance of procedure:  Tolerated well, no immediate complications Comments:     Plan on suture removal in 5 days. Keep covered with Bactroban ointment 3 times daily. May leave open to air or apply a dry dressing.    (including critical care time)  Medications Ordered in UC Medications - No data to display   Initial Impression / Assessment and Plan / UC Course  I have reviewed the triage vital signs and the nursing notes.  Pertinent labs & imaging results that were available during my care of the patient were reviewed by me and considered in my medical decision making (see chart for details).     Plan: 1. Test/x-ray results and diagnosis reviewed with patient 2. rx as per orders; risks, benefits, potential side effects reviewed with patient 3. Recommend supportive treatment with Bactroban to chin 3 times daily to keep moist. To remove sutures in 5 days. He presented infection begin return to clinic immediately. Finger recommended ibuprofen for the traumatic arthritis. Also demonstrated range of motion exercises to prevent joint stiffness. Patient declined wearing a splint. 4. 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 Not Applicable   Lorin Picket, PA-C 12/18/16 1541

## 2016-12-18 NOTE — ED Triage Notes (Signed)
Patient states that this morning approx 3am she was trying to scoot in the bed and somehow hit her chin on her bedside table. Patient presents with chin laceration. Patient states that she some how hurt her left middle finger. Patient has swelling and bruising on the finger.

## 2016-12-23 ENCOUNTER — Ambulatory Visit
Admission: EM | Admit: 2016-12-23 | Discharge: 2016-12-23 | Disposition: A | Discharge status: Home / Self Care | Payer: Medicare Other

## 2016-12-23 DIAGNOSIS — Z4802 Encounter for removal of sutures: Secondary | ICD-10-CM

## 2016-12-23 NOTE — ED Provider Notes (Signed)
[facility]    CSN: [id number] Arrival date & time: 12/23/16  0805     History   Chief Complaint Chief Complaint  Patient presents with  . Suture / Staple Removal    HPI Melanie Page is a 76 y.o. female.   HPI   Today for suture removal. She states that she's had small amount of nonpurulent drainage from the inferior portion of the suture line. Otherwise she's had no problems. She has been using Bactroban ointment.      Past Medical History:  Diagnosis Date  . Clot    small blood clot found after start of chemo.  started on enoxaparin  . Depression   . GERD (gastroesophageal reflux disease)   . Mesothelioma ([place]) 2015   had chemo  . Multilevel degenerative disc disease   . 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 Personal history of chemotherapy   . Thyroid adenoma     There are no active problems to display for this patient.   Past Surgical History:  Procedure Laterality Date  . ABDOMINAL HYSTERECTOMY    . CATARACT EXTRACTION W/PHACO Left 04/13/2016   Procedure: CATARACT EXTRACTION PHACO AND INTRAOCULAR LENS PLACEMENT ([place])  Left eye;  Surgeon: Leandrew Koyanagi, MD;  Location: [facility];  Service: Ophthalmology;  Laterality: Left;  left eye IVA Topicl  . CATARACT EXTRACTION W/PHACO Right 06/15/2016   Procedure: CATARACT EXTRACTION PHACO AND INTRAOCULAR LENS PLACEMENT ([place])  right;  Surgeon: Leandrew Koyanagi, MD;  Location: [facility];  Service: Ophthalmology;  Laterality: Right;  . LUNG BIOPSY    . PORTACATH PLACEMENT    . VARICOSE VEIN SURGERY      OB History    No data available       Home Medications    Prior to Admission medications   Medication Sig Start Date End Date Taking? Authorizing Provider  cabergoline (DOSTINEX) 0.5 MG tablet Take 0.25 mg by mouth 2 (two) times a week. Sunday, Wednesday    [provider]  Cholecalciferol (VITAMIN D-3) 1000 units CAPS Take by mouth  daily.    [provider]  DULoxetine (CYMBALTA) 60 MG capsule Take 60 mg by mouth daily.    [provider]  edoxaban (SAVAYSA) 60 MG TABS tablet Take by mouth daily.    [provider]  folic acid (FOLVITE) 1 MG tablet Take 1 mg by mouth daily.    [provider]  levothyroxine (SYNTHROID, LEVOTHROID) 75 MCG tablet Take 75 mcg by mouth daily before breakfast.    [provider]  loteprednol (LOTEMAX) 0.5 % ophthalmic suspension 2 (two) times daily.    [provider]  Magnesium 250 MG TABS Take by mouth daily.    [provider]  Multiple Vitamin (MULTIVITAMIN) tablet Take 1 tablet by mouth daily.    [provider]  mupirocin ointment (BACTROBAN) 2 % Apply 1 application topically 3 (three) times daily. 12/18/16   Lorin Picket, PA-C  omeprazole (PRILOSEC) 20 MG capsule Take 20 mg by mouth daily.    [provider]  Pembrolizumab (KEYTRUDA IV) Inject into the vein.    [provider]  predniSONE (DELTASONE) 50 MG tablet Take 50 mg by mouth daily with breakfast. 1 tab taken 13 hrs, 7 hrs, and 1 hr prior to scan    [provider]  sertraline (ZOLOFT) 25 MG tablet Take 25 mg by mouth daily.    [provider]  Family History Family History  Problem Relation Age of Onset  . Breast cancer Paternal Aunt     Social History Social History  Substance Use Topics  . Smoking status: Former Smoker    Quit date: 03/07/1968  . Smokeless tobacco: Never Used     Comment: social smoker over 40 yrs ago  . Alcohol use 0.6 oz/week    1 Glasses of wine per week     Allergies   Contrast media [iodinated diagnostic agents] and Penicillins   Review of Systems Review of Systems   Physical Exam Triage Vital Signs ED Triage Vitals [12/23/16 0853]  Enc Vitals Group     BP      Pulse      Resp      Temp      Temp src      SpO2      Weight      Height      Head Circumference       Peak Flow      Pain Score 3     Pain Loc      Pain Edu?      Excl. in [place]?    No data found.   Updated Vital Signs There were no vitals taken for this visit.  Visual Acuity Right Eye Distance:   Left Eye Distance:   Bilateral Distance:    Right Eye Near:   Left Eye Near:    Bilateral Near:     Physical Exam  Skin:  After a thorough cleansing of the skin the sutures were removed. It appears that that one of the bottom sutures is not in place and may have fallen out. 4 total sutures were removed..the patient is aware and if she does notice that the remnants are present she will either pull it out or else return to our clinic.  Nursing note and vitals reviewed.    UC Treatments / Results  Labs (all labs ordered are listed, but only abnormal results are displayed) Labs Reviewed - No data to display  EKG  EKG Interpretation None       Radiology No results found.  Procedures Procedures (including critical care time)  Medications Ordered in UC Medications - No data to display   Initial Impression / Assessment and Plan / UC Course  I have reviewed the triage vital signs and the nursing notes.  Pertinent labs & imaging results that were available during my care of the patient were reviewed by me and considered in my medical decision making (see chart for details).      Final Clinical Impressions(s) / UC Diagnoses   Final diagnoses:  Visit for suture removal    New Prescriptions Discharge Medication List as of 12/23/2016  8:53 AM       Controlled Substance Prescriptions [place] Controlled Substance Registry consulted? Not Applicable   Lorin Picket, PA-C 12/23/16 9924

## 2016-12-23 NOTE — ED Triage Notes (Signed)
Here for suture removal from chin. Site is healing well.

## 2018-01-03 ENCOUNTER — Encounter: Payer: Self-pay | Admitting: Emergency Medicine

## 2018-01-03 ENCOUNTER — Other Ambulatory Visit: Payer: Self-pay

## 2018-01-03 ENCOUNTER — Emergency Department: Payer: Medicare Other

## 2018-01-03 ENCOUNTER — Emergency Department
Admission: EM | Admit: 2018-01-03 | Discharge: 2018-01-04 | Disposition: A | Discharge status: Home / Self Care | Payer: Medicare Other | Attending: Emergency Medicine | Admitting: Emergency Medicine

## 2018-01-03 DIAGNOSIS — W01190A Fall on same level from slipping, tripping and stumbling with subsequent striking against furniture, initial encounter: Secondary | ICD-10-CM | POA: Diagnosis not present

## 2018-01-03 DIAGNOSIS — R040 Epistaxis: Secondary | ICD-10-CM | POA: Insufficient documentation

## 2018-01-03 DIAGNOSIS — S0181XA Laceration without foreign body of other part of head, initial encounter: Secondary | ICD-10-CM | POA: Insufficient documentation

## 2018-01-03 DIAGNOSIS — R51 Headache: Secondary | ICD-10-CM | POA: Insufficient documentation

## 2018-01-03 DIAGNOSIS — S0083XA Contusion of other part of head, initial encounter: Secondary | ICD-10-CM | POA: Insufficient documentation

## 2018-01-03 DIAGNOSIS — S80812A Abrasion, left lower leg, initial encounter: Secondary | ICD-10-CM | POA: Diagnosis not present

## 2018-01-03 DIAGNOSIS — S7002XA Contusion of left hip, initial encounter: Secondary | ICD-10-CM | POA: Diagnosis not present

## 2018-01-03 DIAGNOSIS — Z87891 Personal history of nicotine dependence: Secondary | ICD-10-CM | POA: Diagnosis not present

## 2018-01-03 DIAGNOSIS — Z79899 Other long term (current) drug therapy: Secondary | ICD-10-CM | POA: Diagnosis not present

## 2018-01-03 DIAGNOSIS — R2681 Unsteadiness on feet: Secondary | ICD-10-CM | POA: Insufficient documentation

## 2018-01-03 DIAGNOSIS — Z8589 Personal history of malignant neoplasm of other organs and systems: Secondary | ICD-10-CM | POA: Insufficient documentation

## 2018-01-03 DIAGNOSIS — Y999 Unspecified external cause status: Secondary | ICD-10-CM | POA: Diagnosis not present

## 2018-01-03 DIAGNOSIS — Y9389 Activity, other specified: Secondary | ICD-10-CM | POA: Insufficient documentation

## 2018-01-03 DIAGNOSIS — Y92003 Bedroom of unspecified non-institutional (private) residence as the place of occurrence of the external cause: Secondary | ICD-10-CM | POA: Insufficient documentation

## 2018-01-03 DIAGNOSIS — W19XXXA Unspecified fall, initial encounter: Secondary | ICD-10-CM

## 2018-01-03 DIAGNOSIS — S0993XA Unspecified injury of face, initial encounter: Secondary | ICD-10-CM | POA: Diagnosis present

## 2018-01-03 HISTORY — DX: Other pulmonary embolism without acute cor pulmonale: I26.99

## 2018-01-03 MED ORDER — LEVOTHYROXINE SODIUM 75 MCG PO TABS
75.00 | ORAL_TABLET | ORAL | Status: DC
Start: 2018-01-04 — End: 2018-01-03

## 2018-01-03 MED ORDER — EDOXABAN TOSYLATE 30 MG PO TABS
60.00 | ORAL_TABLET | ORAL | Status: DC
Start: ? — End: 2018-01-03

## 2018-01-03 MED ORDER — GABAPENTIN 300 MG PO CAPS
300.00 | ORAL_CAPSULE | ORAL | Status: DC
Start: 2018-01-03 — End: 2018-01-03

## 2018-01-03 MED ORDER — MULTI-VITAMINS PO TABS
1.00 | ORAL_TABLET | ORAL | Status: DC
Start: 2018-01-04 — End: 2018-01-03

## 2018-01-03 MED ORDER — GENERIC EXTERNAL MEDICATION
2.00 | Status: DC
Start: 2018-01-03 — End: 2018-01-03

## 2018-01-03 MED ORDER — DULOXETINE HCL 60 MG PO CPEP
60.00 | ORAL_CAPSULE | ORAL | Status: DC
Start: 2018-01-04 — End: 2018-01-03

## 2018-01-03 MED ORDER — OXYCODONE HCL 5 MG PO TABS
5.00 | ORAL_TABLET | ORAL | Status: DC
Start: ? — End: 2018-01-03

## 2018-01-03 MED ORDER — CHOLECALCIFEROL 25 MCG (1000 UT) PO TABS
2000.00 | ORAL_TABLET | ORAL | Status: DC
Start: 2018-01-04 — End: 2018-01-03

## 2018-01-03 MED ORDER — PANTOPRAZOLE SODIUM 40 MG PO TBEC
40.00 | DELAYED_RELEASE_TABLET | ORAL | Status: DC
Start: 2018-01-04 — End: 2018-01-03

## 2018-01-03 NOTE — ED Triage Notes (Signed)
Pt tripped and fell over oxygen tubing (first day on oxygen).  Hit head and left hip per pt. No LOC. Is on blood thinners.  Had thoracentesis yesterday per husband.  Pt has malignant mesothelioma.  No fever. Pt only here for fall symptoms today. Denies neck pain. On blood thinners.

## 2018-01-04 ENCOUNTER — Encounter: Payer: Self-pay | Admitting: Emergency Medicine

## 2018-01-04 ENCOUNTER — Other Ambulatory Visit: Payer: Self-pay

## 2018-01-04 MED ORDER — BACITRACIN ZINC 500 UNIT/GM EX OINT
TOPICAL_OINTMENT | CUTANEOUS | Status: AC
  Filled 2018-01-04: qty 2.7

## 2018-01-04 NOTE — ED Notes (Signed)
Pt states she fell today, states was going to bathroom without cane and lost her balance. Hit forehead on dresser with abrasion and bruising noted. Denies any loc, also co left hip pain. Pt was able to walk after incident, has full ROM to all extremities. Pt states she was admitted Tuesday to have "fluid drained from right lung", hx of mesothelioma and has had the same procedure done in the past. Pt states while admitted she was dx with PE's hx of the same. Pt was started on o2 at home after yesterday.

## 2018-01-04 NOTE — Discharge Instructions (Signed)
You have been seen in the Emergency Department (ED) today for a fall.  Your work up does not show any concerning injuries.  Please take over-the-counter Tylenol as needed for your pain (unless you have an allergy or your doctor as told you not to take it).    You reported that you had some IV contrast extravasation in your left arm yesterday while you were at [facility].  We recommend you keep your arm elevated when possible and use cold compresses several times a day to try to help with the swelling.  If the area gets more swollen, painful, or he develop other symptoms that concern you, please return immediately to your doctor or the nearest emergency department.  Please follow up with your doctor regarding today's Emergency Department (ED) visit and your recent fall.    Return to the ED if you have any headache, confusion, slurred speech, weakness/numbness of any arm or leg, or any increased pain.

## 2018-01-04 NOTE — ED Notes (Signed)
Abrasions to left anterior thigh cleaned and antibiotic ointment applied. Abrasion to forehead cleaned and dermabond applied per Dr. Karma Greaser

## 2018-01-04 NOTE — ED Provider Notes (Signed)
[facility] Emergency Department Provider Note  ____________________________________________   First MD Initiated Contact with Patient 01/04/18 0023     (approximate)  I have reviewed the triage vital signs and the nursing notes.   HISTORY  Chief Complaint Fall    HPI Melanie Page is a 77 y.o. female with extensive chronic medical history as listed below who presents for evaluation after a fall.  She had a medical work-up including a thoracentesis at [facility] yesterday and is now on oxygen.  She is not used to it yet and tripped on her oxygen tubing this evening when she was going to the bathroom and lost her balance.  She hit her forehead on the dresser and has a small laceration and bruising on her forehead.  She is also having some left hip pain but has been able to walk after the fall and has full range of motion in all of her extremities.  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  She is here today only for evaluation of her injuries after the fall and has no concerns about her breathing.  She denies fever/chills, chest pain, nausea, vomiting, and abdominal pain.  Of note she also has some swelling in her left upper extremity where she says that the IV contrast extravasated yesterday during the CT scan at [facility] but she says it is not painful, just seems to be getting more red and discolored.  Past Medical History:  Diagnosis Date  . Clot    small blood clot found after start of chemo.  started on enoxaparin  . Depression   . GERD (gastroesophageal reflux disease)   . Mesothelioma ([place]) 2015   had chemo  . Mesothelioma ([place])   . Mesothelioma ([place])   . Multilevel degenerative disc disease   . 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 Personal history of chemotherapy   . Pulmonary embolism ([place])   . Thyroid adenoma     There are no active problems to  display for this patient.   Past Surgical History:  Procedure Laterality Date  . ABDOMINAL HYSTERECTOMY    . CATARACT EXTRACTION W/PHACO Left 04/13/2016   Procedure: CATARACT EXTRACTION PHACO AND INTRAOCULAR LENS PLACEMENT ([place])  Left eye;  Surgeon: Leandrew Koyanagi, MD;  Location: [place];  Service: Ophthalmology;  Laterality: Left;  left eye IVA Topicl  . CATARACT EXTRACTION W/PHACO Right 06/15/2016   Procedure: CATARACT EXTRACTION PHACO AND INTRAOCULAR LENS PLACEMENT ([place])  right;  Surgeon: Leandrew Koyanagi, MD;  Location: [place];  Service: Ophthalmology;  Laterality: Right;  . LUNG BIOPSY    . PORTACATH PLACEMENT    . VARICOSE VEIN SURGERY      Prior to Admission medications   Medication Sig Start Date End Date Taking? Authorizing Provider  cabergoline (DOSTINEX) 0.5 MG tablet Take 0.25 mg by mouth 2 (two) times a week. Sunday, Wednesday    [provider]  Cholecalciferol (VITAMIN D-3) 1000 units CAPS Take by mouth daily.    [provider]  DULoxetine (CYMBALTA) 60 MG capsule Take 60 mg by mouth daily.    [provider]  edoxaban (SAVAYSA) 60 MG TABS tablet Take by mouth daily.    [provider]  folic acid (FOLVITE) 1 MG tablet Take 1 mg by mouth daily.    [provider]  levothyroxine (SYNTHROID, LEVOTHROID) 75 MCG tablet Take 75  mcg by mouth daily before breakfast.    [provider]  loteprednol (LOTEMAX) 0.5 % ophthalmic suspension 2 (two) times daily.    [provider]  Magnesium 250 MG TABS Take by mouth daily.    [provider]  Multiple Vitamin (MULTIVITAMIN) tablet Take 1 tablet by mouth daily.    [provider]  mupirocin ointment (BACTROBAN) 2 % Apply 1 application topically 3 (three) times daily. 12/18/16   Lorin Picket, PA-C  omeprazole (PRILOSEC) 20 MG capsule Take 20 mg by mouth daily.    [provider]  Pembrolizumab (KEYTRUDA IV) Inject  into the vein.    [provider]  predniSONE (DELTASONE) 50 MG tablet Take 50 mg by mouth daily with breakfast. 1 tab taken 13 hrs, 7 hrs, and 1 hr prior to scan    [provider]  sertraline (ZOLOFT) 25 MG tablet Take 25 mg by mouth daily.    [provider]    Allergies Contrast media [iodinated diagnostic agents] and Penicillins  Family History  Problem Relation Age of Onset  . Breast cancer Paternal Aunt     Social History Social History   Tobacco Use  . Smoking status: Former Smoker    Last attempt to quit: 03/07/1968    Years since quitting: 49.8  . Smokeless tobacco: Never Used  . Tobacco comment: social smoker over 40 yrs ago  Substance Use Topics  . Alcohol use: Yes    Alcohol/week: 1.0 standard drinks    Types: 1 Glasses of wine per week  . Drug use: No    Review of Systems Constitutional: No fever/chills Eyes: No visual changes. ENT: No sore throat. Cardiovascular: Denies chest pain. Respiratory: Chronic shortness of breath. Gastrointestinal: No abdominal pain.  No nausea, no vomiting.  No diarrhea.  No constipation. Genitourinary: Negative for dysuria. Musculoskeletal: Injury to forehead, pain in left hip, both occurred after fall.  Chronic neck and back pain. Integumentary: Negative for rash. Neurological: Negative for headaches, focal weakness or numbness.   ____________________________________________   PHYSICAL EXAM:  VITAL SIGNS: ED Triage Vitals  Enc Vitals Group     BP 01/03/18 2219 127/75     Pulse Rate 01/03/18 2219 (!) 118     Resp 01/03/18 2219 (!) 24     Temp 01/03/18 2219 97.8 F (36.6 C)     Temp Source 01/03/18 2219 Oral     SpO2 01/03/18 2219 92 %     Weight 01/03/18 2222 71.7 kg (158 lb)     Height 01/03/18 2222 1.575 m (5\' 2" )     Head Circumference --      Peak Flow --      Pain Score 01/03/18 2221 3     Pain Loc --      Pain Edu? --      Excl. in [place]? --     Constitutional: Alert and oriented.   Appears chronically ill but is in no acute distress. Eyes: Conjunctivae are normal. PERRL. EOMI. Head: Hematoma and bruising to the middle of her forehead with a less than 1 cm superficial laceration.  Nose: Dried epistaxis bilateral nares.  Some tenderness to palpation of the bridge of the nose. Mouth/Throat: Mucous membranes are moist. Neck: No stridor.  No meningeal signs.  No cervical spine tenderness to palpation. Cardiovascular: Normal rate, regular rhythm. Good peripheral circulation. Grossly normal heart sounds. Respiratory: Normal respiratory effort.  No retractions. Lungs CTAB. Gastrointestinal: Soft and nontender. No distention.  Musculoskeletal: No  lower extremity tenderness nor edema. No gross deformities of extremities. Neurologic:  Normal speech and language. No gross focal neurologic deficits are appreciated.  Skin:  Skin is warm, dry and intact.  Superficial abrasions along the proximal aspect of the left lower extremity.  She also has a significant amount of bruising in the left upper extremity at the site of her CT scan yesterday that has some extravasation but there is no sign of active infection.  The area is nontender and not painful.  Compartments are easily palpable. Psychiatric: Mood and affect are normal. Speech and behavior are normal.  ____________________________________________   LABS (all labs ordered are listed, but only abnormal results are displayed)  Labs Reviewed - No data to display ____________________________________________  EKG  None - EKG not ordered by ED physician ____________________________________________  RADIOLOGY   ED MD interpretation: No indication of acute traumatic injury in the brain or to the cervical spine.  No indication of fracture or dislocation on the radiographs of the left hip.  Official radiology report(s): Ct Head Wo Contrast  Result Date: 01/03/2018 CLINICAL DATA:  Patient tripped and fell hitting head. Patient is  on blood thinners. History of malignant mesothelioma. EXAM: CT HEAD WITHOUT CONTRAST; CT CERVICAL SPINE WITHOUT CONTRAST TECHNIQUE: Contiguous axial images were obtained from the base of the skull through the vertex without intravenous contrast. COMPARISON:  None. FINDINGS: Brain: Partly empty pituitary sella. Mild superficial atrophy with chronic microvascular ischemic disease of periventricular white matter. No large vascular territory infarct, hemorrhage or midline shift. Partial volume averaging of a left temporal lobe sulcus on series 2, image 11 simulates an infarct. No extra-axial fluid collections. Midline fourth ventricle and basal cisterns without effacement. Brainstem and cerebellum are nonacute. Vascular: Atherosclerosis of the vertebral and cavernous sinus carotids. Skull: Intact Sinuses/Orbits: Right ethmoid mucous retention cyst. Bilateral lens replacements. Intact orbits and globes. Other: None CT CERVICAL SPINE FINDINGS Alignment: Normal. Skull base and vertebrae: No acute fracture. No primary bone lesion or focal pathologic process. Soft tissues and spinal canal: No prevertebral fluid or swelling. No visible canal hematoma. Disc levels: Moderate-to-marked degenerative disc flattening C2 through T1 with small posterior marginal osteophytes at C3-4. Multilevel degenerative facet arthropathy with uncovertebral joint osteoarthritis and uncinate spurring bilaterally at C3-4, on the right at C4-5 and bilaterally at C5-6 and C6-7. Slight bilateral C3-4 neural foraminal encroachment. Upper chest: Negative. Other: None IMPRESSION: CT head: Mild superficial atrophy and chronic microvascular ischemic disease. No acute intracranial abnormality. Partially empty pituitary sella. CT cervical spine: Cervical spondylosis without acute cervical spine fracture or static listhesis. Electronically Signed   By: Ashley Royalty M.D.   On: 01/03/2018 23:03   Ct Cervical Spine Wo Contrast  Result Date: 01/03/2018 CLINICAL  DATA:  Patient tripped and fell hitting head. Patient is on blood thinners. History of malignant mesothelioma. EXAM: CT HEAD WITHOUT CONTRAST; CT CERVICAL SPINE WITHOUT CONTRAST TECHNIQUE: Contiguous axial images were obtained from the base of the skull through the vertex without intravenous contrast. COMPARISON:  None. FINDINGS: Brain: Partly empty pituitary sella. Mild superficial atrophy with chronic microvascular ischemic disease of periventricular white matter. No large vascular territory infarct, hemorrhage or midline shift. Partial volume averaging of a left temporal lobe sulcus on series 2, image 11 simulates an infarct. No extra-axial fluid collections. Midline fourth ventricle and basal cisterns without effacement. Brainstem and cerebellum are nonacute. Vascular: Atherosclerosis of the vertebral and cavernous sinus carotids. Skull: Intact Sinuses/Orbits: Right ethmoid mucous retention cyst. Bilateral lens replacements. Intact orbits  and globes. Other: None CT CERVICAL SPINE FINDINGS Alignment: Normal. Skull base and vertebrae: No acute fracture. No primary bone lesion or focal pathologic process. Soft tissues and spinal canal: No prevertebral fluid or swelling. No visible canal hematoma. Disc levels: Moderate-to-marked degenerative disc flattening C2 through T1 with small posterior marginal osteophytes at C3-4. Multilevel degenerative facet arthropathy with uncovertebral joint osteoarthritis and uncinate spurring bilaterally at C3-4, on the right at C4-5 and bilaterally at C5-6 and C6-7. Slight bilateral C3-4 neural foraminal encroachment. Upper chest: Negative. Other: None IMPRESSION: CT head: Mild superficial atrophy and chronic microvascular ischemic disease. No acute intracranial abnormality. Partially empty pituitary sella. CT cervical spine: Cervical spondylosis without acute cervical spine fracture or static listhesis. Electronically Signed   By: Ashley Royalty M.D.   On: 01/03/2018 23:03   Dg Hip  Unilat With Pelvis 2-3 Views Left  Result Date: 01/03/2018 CLINICAL DATA:  Patient tripped and fell over oxygen tubing. Left hip pain. EXAM: DG HIP (WITH OR WITHOUT PELVIS) 2-3V LEFT COMPARISON:  None. FINDINGS: Lower lumbar facet arthrosis at L4-5 and L5-S1. No acute pelvic fracture or diastasis. Joint space narrowing of both hips without acute fracture. Opacification of the urine within the bladder is noted with prolapsed appearance of the bladder. IMPRESSION: No acute osseous abnormality identified. Lower lumbar degenerative facet arthropathy. Bladder prolapse is suggested. Electronically Signed   By: Ashley Royalty M.D.   On: 01/03/2018 22:49    ____________________________________________   PROCEDURES  Critical Care performed: No   Procedure(s) performed:   Marland KitchenMarland KitchenLaceration Repair Date/Time: 01/04/2018 12:42 AM Performed by: Hinda Kehr, MD Authorized by: Hinda Kehr, MD   Consent:    Consent obtained:  Verbal   Consent given by:  Patient Anesthesia (see MAR for exact dosages):    Anesthesia method:  None Laceration details:    Location:  Face   Face location:  Forehead   Length (cm):  1 Repair type:    Repair type:  Simple Exploration:    Contaminated: no   Treatment:    Amount of cleaning:  Standard   Irrigation solution:  Sterile saline   Visualized foreign bodies/material removed: no   Skin repair:    Repair method:  Tissue adhesive Approximation:    Approximation:  Close Post-procedure details:    Dressing:  Open (no dressing)   Patient tolerance of procedure:  Tolerated well, no immediate complications     ____________________________________________   INITIAL IMPRESSION / ASSESSMENT AND PLAN / ED COURSE  As part of my medical decision making, I reviewed the following data within the [place] History obtained from family, Nursing notes reviewed and incorporated, Old chart reviewed and Radiograph reviewed     Differential diagnosis  includes, but is not limited to, mechanical fall, intracranial bleeding, fracture/dislocation, musculoskeletal pain.  Fortunately the patient has no acute fractures or dislocations as interpreted by the radiologist on the CT head and cervical spine.  She has a small laceration was repaired with Dermabond.  She is weightbearing and unsteady at baseline but she and her husband are comfortable with plan for her to go home.  She already has oxygen and had an extensive work-up at [facility] yesterday.  No indication for further work-up.  She is in no acute distress.  I encouraged her to keep her left upper extremity elevated and use cold compresses when possible to help with the recent extravasation of IV contrast and to follow-up with her doctor at the next available opportunity.  I gave  my usual customary return precautions and she and her husband understand and agree with the plan.     ____________________________________________  FINAL CLINICAL IMPRESSION(S) / ED DIAGNOSES  Final diagnoses:  Fall, initial encounter  Forehead laceration, initial encounter  Contusion of left hip, initial encounter  Contusion of face, initial encounter     MEDICATIONS GIVEN DURING THIS VISIT:  Medications  bacitracin 500 UNIT/GM ointment (has no administration in time range)     ED Discharge Orders    None       Note:  This document was prepared using Dragon voice recognition software and may include unintentional dictation errors.    Hinda Kehr, MD 01/04/18 [phone]

## 2018-01-04 NOTE — ED Notes (Signed)
Patient discharged to home per MD order. Patient in stable condition, and deemed medically cleared by ED provider for discharge. Discharge instructions reviewed with patient/family using "Teach Back"; verbalized understanding of medication education and administration, and information about follow-up care. Denies further concerns. ° °

## 2018-01-19 ENCOUNTER — Emergency Department

## 2018-01-19 ENCOUNTER — Other Ambulatory Visit: Payer: Self-pay

## 2018-01-19 ENCOUNTER — Emergency Department
Admission: EM | Admit: 2018-01-19 | Discharge: 2018-02-04 | Disposition: E | Attending: Emergency Medicine | Admitting: Emergency Medicine

## 2018-01-19 DIAGNOSIS — R579 Shock, unspecified: Secondary | ICD-10-CM | POA: Diagnosis present

## 2018-01-19 DIAGNOSIS — Z87891 Personal history of nicotine dependence: Secondary | ICD-10-CM | POA: Insufficient documentation

## 2018-01-19 DIAGNOSIS — E872 Acidosis, unspecified: Secondary | ICD-10-CM

## 2018-01-19 DIAGNOSIS — J9601 Acute respiratory failure with hypoxia: Secondary | ICD-10-CM | POA: Diagnosis not present

## 2018-01-19 DIAGNOSIS — Z79899 Other long term (current) drug therapy: Secondary | ICD-10-CM | POA: Insufficient documentation

## 2018-01-19 LAB — COMPREHENSIVE METABOLIC PANEL
ALT: 14 U/L (ref 0–44)
ANION GAP: 18 — AB (ref 5–15)
AST: 36 U/L (ref 15–41)
Albumin: 2.5 g/dL — ABNORMAL LOW (ref 3.5–5.0)
Alkaline Phosphatase: 75 U/L (ref 38–126)
BUN: 48 mg/dL — ABNORMAL HIGH (ref 8–23)
CALCIUM: 8.6 mg/dL — AB (ref 8.9–10.3)
CHLORIDE: 95 mmol/L — AB (ref 98–111)
CO2: 21 mmol/L — AB (ref 22–32)
CREATININE: 1.84 mg/dL — AB (ref 0.44–1.00)
GFR calc Af Amer: 30 mL/min — ABNORMAL LOW (ref >60)
GFR, EST NON AFRICAN AMERICAN: 26 mL/min — AB (ref >60)
Glucose, Bld: 196 mg/dL — ABNORMAL HIGH (ref 70–99)
Potassium: 4.3 mmol/L (ref 3.5–5.1)
SODIUM: 134 mmol/L — AB (ref 135–145)
Total Bilirubin: 0.8 mg/dL (ref 0.3–1.2)
Total Protein: 5.2 g/dL — ABNORMAL LOW (ref 6.5–8.1)

## 2018-01-19 LAB — BLOOD CULTURE ID PANEL (REFLEXED)

## 2018-01-19 LAB — URINALYSIS, COMPLETE (UACMP) WITH MICROSCOPIC
Bacteria, UA: NONE SEEN
Bilirubin Urine: NEGATIVE
Glucose, UA: NEGATIVE mg/dL
Hgb urine dipstick: NEGATIVE
Ketones, ur: NEGATIVE mg/dL
Leukocytes, UA: NEGATIVE
Nitrite: NEGATIVE
PH: 5 (ref 5.0–8.0)
Protein, ur: NEGATIVE mg/dL
SPECIFIC GRAVITY, URINE: 1.014 (ref 1.005–1.030)
SQUAMOUS EPITHELIAL / LPF: NONE SEEN (ref 0–5)
WBC, UA: NONE SEEN WBC/hpf (ref 0–5)

## 2018-01-19 LAB — CBC WITH DIFFERENTIAL/PLATELET
ABS IMMATURE GRANULOCYTES: 0.48 10*3/uL — AB (ref 0.00–0.07)
Basophils Absolute: 0 10*3/uL (ref 0.0–0.1)
Basophils Relative: 0 %
EOS ABS: 0 10*3/uL (ref 0.0–0.5)
Eosinophils Relative: 0 %
HCT: 28.1 % — ABNORMAL LOW (ref 36.0–46.0)
Hemoglobin: 7.9 g/dL — ABNORMAL LOW (ref 12.0–15.0)
Immature Granulocytes: 3 %
Lymphocytes Relative: 4 %
Lymphs Abs: 0.8 10*3/uL (ref 0.7–4.0)
MCH: 26.5 pg (ref 26.0–34.0)
MCHC: 28.1 g/dL — ABNORMAL LOW (ref 30.0–36.0)
MCV: 94.3 fL (ref 80.0–100.0)
MONO ABS: 0.7 10*3/uL (ref 0.1–1.0)
MONOS PCT: 4 %
NEUTROS ABS: 16.3 10*3/uL — AB (ref 1.7–7.7)
NEUTROS PCT: 89 %
Platelets: 467 10*3/uL — ABNORMAL HIGH (ref 150–400)
RBC: 2.98 MIL/uL — AB (ref 3.87–5.11)
RDW: 18 % — AB (ref 11.5–15.5)
WBC: 18.3 10*3/uL — AB (ref 4.0–10.5)
nRBC: 0.2 % (ref 0.0–0.2)

## 2018-01-19 LAB — BLOOD GAS, ARTERIAL
Acid-base deficit: 5.5 mmol/L — ABNORMAL HIGH (ref 0.0–2.0)
Bicarbonate: 23.3 mmol/L (ref 20.0–28.0)
FIO2: 0.7
LHR: 18 {breaths}/min
O2 Saturation: 67 %
PEEP: 10 cmH2O
Patient temperature: 37
VT: 450 mL
pCO2 arterial: 64 mmHg — ABNORMAL HIGH (ref 32.0–48.0)
pH, Arterial: 7.17 — CL (ref 7.350–7.450)
pO2, Arterial: 45 mmHg — ABNORMAL LOW (ref 83.0–108.0)

## 2018-01-19 LAB — TROPONIN I: TROPONIN I: 0.04 ng/mL — AB (ref <0.03)

## 2018-01-19 LAB — LACTIC ACID, PLASMA: LACTIC ACID, VENOUS: 6.8 mmol/L — AB (ref 0.5–1.9)

## 2018-01-19 MED ORDER — PROPOFOL 1000 MG/100ML IV EMUL: 5.0000 ug/kg/min | Freq: Once | INTRAVENOUS | Status: DC

## 2018-01-19 MED ORDER — ROCURONIUM BROMIDE 50 MG/5ML IV SOLN
80.0000 mg | Freq: Once | INTRAVENOUS | Status: AC
  Administered 2018-01-19: 80 mg via INTRAVENOUS

## 2018-01-19 MED ORDER — FENTANYL CITRATE (PF) 100 MCG/2ML IJ SOLN
100.0000 ug | Freq: Once | INTRAMUSCULAR | Status: AC
  Administered 2018-01-19: 100 ug via INTRAVENOUS

## 2018-01-19 MED ORDER — KETAMINE HCL 10 MG/ML IJ SOLN
100.0000 mg | Freq: Once | INTRAMUSCULAR | Status: AC
  Administered 2018-01-19: 100 mg via INTRAVENOUS

## 2018-01-19 MED ORDER — METRONIDAZOLE IN NACL 5-0.79 MG/ML-% IV SOLN
500.0000 mg | Freq: Once | INTRAVENOUS | Status: DC
  Filled 2018-01-19: qty 100

## 2018-01-19 MED ORDER — SUGAMMADEX SODIUM 200 MG/2ML IV SOLN
16.0000 mg/kg | Freq: Once | INTRAVENOUS | Status: AC
  Administered 2018-01-19: 1150 mg via INTRAVENOUS
  Filled 2018-01-19: qty 12

## 2018-01-19 MED ORDER — FENTANYL 2500MCG IN NS 250ML (10MCG/ML) PREMIX INFUSION
100.0000 ug/h | INTRAVENOUS | Status: DC
  Administered 2018-01-19: 200 ug/h via INTRAVENOUS
  Filled 2018-01-19: qty 250

## 2018-01-19 MED ORDER — VANCOMYCIN HCL IN DEXTROSE 1-5 GM/200ML-% IV SOLN: 1000.0000 mg | Freq: Once | INTRAVENOUS | Status: DC

## 2018-01-19 MED ORDER — SODIUM CHLORIDE 0.9 % IV BOLUS
1000.0000 mL | Freq: Once | INTRAVENOUS | Status: AC
  Administered 2018-01-19: 1000 mL via INTRAVENOUS

## 2018-01-19 MED ORDER — LEVOFLOXACIN IN D5W 750 MG/150ML IV SOLN
750.0000 mg | Freq: Once | INTRAVENOUS | Status: DC
  Filled 2018-01-19: qty 150

## 2018-01-19 MED ORDER — NOREPINEPHRINE 4 MG/250ML-% IV SOLN
0.0000 ug/min | Freq: Once | INTRAVENOUS | Status: AC
  Administered 2018-01-19: 10 ug/min via INTRAVENOUS

## 2018-01-19 MED ORDER — GLYCOPYRROLATE 0.2 MG/ML IJ SOLN
0.2000 mg | Freq: Once | INTRAMUSCULAR | Status: AC
  Administered 2018-01-19: 0.2 mg via INTRAVENOUS
  Filled 2018-01-19: qty 1

## 2018-01-19 MED ORDER — PROPOFOL 1000 MG/100ML IV EMUL
INTRAVENOUS | Status: AC
  Filled 2018-01-19: qty 100

## 2018-01-20 LAB — URINE CULTURE: CULTURE: NO GROWTH

## 2018-01-22 LAB — CULTURE, BLOOD (ROUTINE X 2)

## 2018-01-24 LAB — CULTURE, BLOOD (ROUTINE X 2): CULTURE: NO GROWTH

## 2018-02-04 NOTE — ED Notes (Addendum)
Patient's husband at bedside, decision for comfort care made. Fentanyl bolus 241mcg from pump given per verbal order from Dr. Mable Paris. Fentanyl drip started at 210mcg/hr.

## 2018-02-04 NOTE — ED Notes (Signed)
Patient currently being preoxygenated with BVM.  01:23 100mg  ketamine given at this time by Apolonio Schneiders, RN 01:24 80mg  Rocuronium given at this time by [facility].  01:26 Successful intubated by Dr. Mable Paris , 7.5 ET tube 23cm at the lip. Bilateral breath sounds, positive color change.

## 2018-02-04 NOTE — ED Notes (Signed)
Patient extubated at this time. Husband at bedside.

## 2018-02-04 NOTE — ED Notes (Signed)
Call to [place] Doesn't meet criteria [phone]

## 2018-02-04 NOTE — ED Triage Notes (Signed)
Patient arrives via EMS from home. Patient is a hospice patient, full code. EMS called out for AMS. Patient hypoxoc, EMS started CPAP, patient became hypotensive, EMS bagged upon arrival. Patient responsive to pain.

## 2018-02-04 NOTE — ED Provider Notes (Signed)
[facility] Emergency Department Provider Note  ____________________________________________   First MD Initiated Contact with Patient February 14, 2018 0129     (approximate)  I have reviewed the triage vital signs and the nursing notes.   HISTORY  Chief Complaint Altered Mental Status  Level 5 exemption history limited by the patient's clinical condition  HPI Melanie Page is a 77 y.o. female who comes to the emergency department via EMS with shortness of breath and altered mental status.  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   Even though the patient is in hospice she is full code.  EMS stated they were called for altered mental status however when they arrived they found the patient in severe distress began assisting ventilations and transported her rapidly to our emergency department.  Further history is limited as the patient is encephalopathic and unable to provide any meaningful history whatsoever.   Past Medical History:  Diagnosis Date  . Clot    small blood clot found after start of chemo.  started on enoxaparin  . Depression   . GERD (gastroesophageal reflux disease)   . Mesothelioma ([place]) 2015   had chemo  . Mesothelioma ([place])   . Mesothelioma ([place])   . Multilevel degenerative disc disease   . 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 Personal history of chemotherapy   . Pulmonary embolism ([place])   . Thyroid adenoma     There are no active problems to display for this patient.   Past Surgical History:  Procedure Laterality Date  . ABDOMINAL HYSTERECTOMY    . CATARACT EXTRACTION W/PHACO Left 04/13/2016   Procedure: CATARACT EXTRACTION PHACO AND INTRAOCULAR LENS PLACEMENT ([place])  Left eye;  Surgeon: Leandrew Koyanagi, MD;  Location: [facility];  Service: Ophthalmology;  Laterality: Left;  left eye IVA Topicl  .  CATARACT EXTRACTION W/PHACO Right 06/15/2016   Procedure: CATARACT EXTRACTION PHACO AND INTRAOCULAR LENS PLACEMENT ([place])  right;  Surgeon: Leandrew Koyanagi, MD;  Location: [facility];  Service: Ophthalmology;  Laterality: Right;  . LUNG BIOPSY    . PORTACATH PLACEMENT    . VARICOSE VEIN SURGERY      Prior to Admission medications   Medication Sig Start Date End Date Taking? Authorizing Provider  cabergoline (DOSTINEX) 0.5 MG tablet Take 0.25 mg by mouth 2 (two) times a week. Sunday, Wednesday    [provider]  Cholecalciferol (VITAMIN D-3) 1000 units CAPS Take by mouth daily.    [provider]  DULoxetine (CYMBALTA) 60 MG capsule Take 60 mg by mouth daily.    [provider]  edoxaban (SAVAYSA) 60 MG TABS tablet Take by mouth daily.    [provider]  folic acid (FOLVITE) 1 MG tablet Take 1 mg by mouth daily.    [provider]  levothyroxine (SYNTHROID, LEVOTHROID) 75 MCG tablet Take 75 mcg by mouth daily before breakfast.    [provider]  loteprednol (LOTEMAX) 0.5 % ophthalmic suspension 2 (two) times daily.    [provider]  Magnesium 250 MG TABS Take by mouth daily.    [provider]  Multiple Vitamin (MULTIVITAMIN) tablet Take 1 tablet by mouth daily.    [provider]  mupirocin ointment (BACTROBAN) 2 % Apply 1 application topically 3 (three) times daily. 12/18/16   Lorin Picket, PA-C  omeprazole (PRILOSEC) 20 MG capsule Take  20 mg by mouth daily.    [provider]  Pembrolizumab (KEYTRUDA IV) Inject into the vein.    [provider]  predniSONE (DELTASONE) 50 MG tablet Take 50 mg by mouth daily with breakfast. 1 tab taken 13 hrs, 7 hrs, and 1 hr prior to scan    [provider]  sertraline (ZOLOFT) 25 MG tablet Take 25 mg by mouth daily.    [provider]    Allergies Contrast media [iodinated diagnostic agents] and Penicillins  Family  History  Problem Relation Age of Onset  . Breast cancer Paternal Aunt     Social History Social History   Tobacco Use  . Smoking status: Former Smoker    Last attempt to quit: 03/07/1968    Years since quitting: 49.9  . Smokeless tobacco: Never Used  . Tobacco comment: social smoker over 40 yrs ago  Substance Use Topics  . Alcohol use: Yes    Alcohol/week: 1.0 standard drinks    Types: 1 Glasses of wine per week  . Drug use: No    Review of Systems Level 5 exemption history limited by the patient's clinical condition  ____________________________________________   PHYSICAL EXAM:  VITAL SIGNS: ED Triage Vitals  Enc Vitals Group     BP      Pulse      Resp      Temp      Temp src      SpO2      Weight      Height      Head Circumference      Peak Flow      Pain Score      Pain Loc      Pain Edu?      Excl. in [place]?     Constitutional: Arrives critically ill localizing to painful stimulus although requiring bag-valve-mask Eyes: PERRL EOMI. midrange and sluggish Head: Atraumatic. Nose: No congestion/rhinnorhea. Mouth/Throat: Copious secretions Neck: No stridor.   Cardiovascular: Tachycardic rate, regular rhythm. Grossly normal heart sounds.  Good peripheral circulation. Respiratory: Severe respiratory distress.  Not really breathing on her own and with bagging she has rhonchorous breath sounds throughout Gastrointestinal: Soft nontender Musculoskeletal: No lower extremity edema   Neurologic: Localizes in all 4 extremities Skin:  Skin is warm, dry and intact. No rash noted. Psychiatric: Encephalopathic and comatose   ____________________________________________   DIFFERENTIAL includes but not limited to  Aspiration, sepsis, septic shock, pulmonary embolism, pneumonia ____________________________________________   LABS (all labs ordered are listed, but only abnormal results are displayed)  Labs Reviewed  COMPREHENSIVE METABOLIC PANEL - Abnormal; Notable  for the following components:      Result Value   Sodium 134 (*)    Chloride 95 (*)    CO2 21 (*)    Glucose, Bld 196 (*)    BUN 48 (*)    Creatinine, Ser 1.84 (*)    Calcium 8.6 (*)    Total Protein 5.2 (*)    Albumin 2.5 (*)    GFR calc non Af Amer 26 (*)    GFR calc Af Amer 30 (*)    Anion gap 18 (*)    All other components within normal limits  TROPONIN I - Abnormal; Notable for the following components:   Troponin I 0.04 (*)    All other components within normal limits  LACTIC ACID, PLASMA - Abnormal; Notable for the following components:   Lactic Acid, Venous 6.8 (*)    All other components  within normal limits  CBC WITH DIFFERENTIAL/PLATELET - Abnormal; Notable for the following components:   WBC 18.3 (*)    RBC 2.98 (*)    Hemoglobin 7.9 (*)    HCT 28.1 (*)    MCHC 28.1 (*)    RDW 18.0 (*)    Platelets 467 (*)    Neutro Abs 16.3 (*)    Abs Immature Granulocytes 0.48 (*)    All other components within normal limits  URINALYSIS, COMPLETE (UACMP) WITH MICROSCOPIC - Abnormal; Notable for the following components:   Color, Urine YELLOW (*)    APPearance CLEAR (*)    All other components within normal limits  BLOOD GAS, ARTERIAL - Abnormal; Notable for the following components:   pH, Arterial 7.17 (*)    pCO2 arterial 64 (*)    pO2, Arterial 45 (*)    Acid-base deficit 5.5 (*)    All other components within normal limits  CULTURE, BLOOD (ROUTINE X 2)  CULTURE, BLOOD (ROUTINE X 2)  URINE CULTURE  LACTIC ACID, PLASMA    Lab work reviewed by me with a number of critical abnormalities.  Her white count is 18 concerning for infection.  Hemoglobin is 7.8 concerning for anemia of chronic disease versus active GI bleed.  Lactic acidosis to greater than 6 concerning for under resuscitation __________________________________________  EKG   ____________________________________________  RADIOLOGY   KUB reviewed by me shows orogastric tube needs to be advanced chest  x-ray reviewed by me shows endotracheal tube in good position ____________________________________________   PROCEDURES  Procedure(s) performed: Yes  .Critical Care Performed by: Darel Hong, MD Authorized by: Darel Hong, MD   Critical care provider statement:    Critical care time (minutes):  40   Critical care time was exclusive of:  Separately billable procedures and treating other patients   Critical care was necessary to treat or prevent imminent or life-threatening deterioration of the following conditions:  Respiratory failure   Critical care was time spent personally by me on the following activities:  Development of treatment plan with patient or surrogate, discussions with consultants, evaluation of patient's response to treatment, examination of patient, obtaining history from patient or surrogate, ordering and performing treatments and interventions, ordering and review of laboratory studies, ordering and review of radiographic studies, pulse oximetry, re-evaluation of patient's condition and review of old charts Procedure Name: Intubation Date/Time: February 03, 2018 8:20 AM Performed by: Darel Hong, MD Pre-anesthesia Checklist: Patient identified, Patient being monitored, Emergency Drugs available, Timeout performed and Suction available Oxygen Delivery Method: Non-rebreather mask Preoxygenation: Pre-oxygenation with 100% oxygen Induction Type: Rapid sequence Ventilation: Mask ventilation without difficulty Laryngoscope Size: Mac and 4 Grade View: Grade II Tube size: 7.5 mm Placement Confirmation: ETT inserted through vocal cords under direct vision,  CO2 detector and Breath sounds checked- equal and bilateral Secured at: 22 cm Tube secured with: ETT holder Comments: Intubated without complication using ketamine and rocuronium although the patient was clearly aspirating    IO LINE INSERTION Date/Time: Feb 03, 2018 8:20 AM Performed by: Darel Hong,  MD Authorized by: Darel Hong, MD   Consent:    Consent obtained:  Emergent situation Pre-procedure details:    Site preparation:  Chlorhexidine Anesthesia (see MAR for exact dosages):    Anesthesia method:  None Procedure details:    Insertion site:  R proximal tibia   Insertion device:  Drill device   Insertion: Needle was inserted through the bony cortex     Number of attempts:  2   Insertion confirmation:  Aspiration of blood/marrow, easy infusion of fluids and stability of the needle Post-procedure details:    Patient tolerance of procedure:  Tolerated well, no immediate complications Comments:     Use chlorhexidine and initially attempted to place an IO line right proximal tibia however I placed it immediately and it was in her soft tissue.  I then replaced it and the second attempt was successful with trying marrow and infusing easily.  After infusing the patient screamed briefly OG placement Date/Time: Jan 22, 2018 8:20 AM Performed by: Darel Hong, MD Authorized by: Darel Hong, MD  Unsuccessful attempt Local anesthesia used: no  Anesthesia: Local anesthesia used: no  Sedation: Patient sedated: yes Vitals: Vital signs were monitored during sedation.  Comments: After endotracheal intubation with ketamine and rocuronium I attempted to place orogastric tube was able to pass it to about 45 cm however it became more difficult to pass and the procedure was subsequently aborted secondary to my concern for early.  X-rays showed that the tip of the catheter was in her distal esophagus however the patient denied prior to advancing further  ARTERIAL BLOOD GAS Date/Time: 01/22/2018 2:00 AM Performed by: Darel Hong, MD Authorized by: Darel Hong, MD   Consent:    Consent obtained:  Emergent situation Anesthesia (see MAR for exact dosages):    Anesthesia method: intubated and sedated. Procedure details:    Location:  L radial   Allen's test performed: yes      Number of attempts:  2 Post-procedure details:    Dressing applied: yes     Bleeding:  Hemostasis achieved   Circulation, movement, and sensation:  Normal   Patient tolerance of procedure:  Tolerated well, no immediate complications    Critical Care performed: Yes  ____________________________________________   INITIAL IMPRESSION / ASSESSMENT AND PLAN / ED COURSE  Pertinent labs & imaging results that were available during my care of the patient were reviewed by me and considered in my medical decision making (see chart for details).   As part of my medical decision making, I reviewed the following data within the [place] History obtained from family if available, nursing notes, old chart and ekg, as well as notes from prior ED visits.  The patient comes to the emergency department critically ill being bagged by EMS.  Her situation was complex as she was in home hospice but was full code which was confirmed multiple times.  Patient was hypotensive and hypoxic and clearly aspirating and had no IV access I placed a right intraosseous tibial line.  Shortly thereafter nursing was able to place 2 gauge left-sided IV.  Decision was made to intubate to protect the patient's airway and she was intubated without complication.  She was subsequently placed on norepinephrine and broad-spectrum antibiotics were ordered along with fentanyl and propofol infusions for pain and sedation.  After the patient was somewhat stabilized her husband arrived and I had a lengthy discussion with him in the family room.  The patient and the husband were fully aware that she was nearing the end of her life and her cancer was terminal.  This evening she became more short of breath and the husband says that he "panicked" and called 911.  He did not want her to die at home and is asking only for pain medication.  He said the patient would absolutely never want to be on a ventilator.  He understands she is  most likely dying tonight.  Unfortunately I used rocuronium for the intubation  as I did not know the patient's potassium and she persisted to be fully paralyzed.  I spoke with pharmacy and we were able to bring up sugammadex which was given and about 30 seconds thereafter the patient opened her eyes and was overbreathing the vent.  We also gave her 0.2 mg of glycopyrrolate to help with her secretions.  At 2:30 AM personally extubated the patient with her husband at bedside holding her hand.  Following extubation I asked the patient if she was in any pain and she shook her head and said "now".  Monitor was placed on comfort settings and the patient's husband was at bedside.  I initially admitted her to the hospital as it was unclear how long she would survive however after less than an hour at 3:13 AM the patient expired.  The chaplain had been to visit the patient's husband and all questions were answered.      ____________________________________________   FINAL CLINICAL IMPRESSION(S) / ED DIAGNOSES  Final diagnoses:  Shock ([place])  Lactic acidosis  Acute respiratory failure with hypoxia ([place])      NEW MEDICATIONS STARTED DURING THIS VISIT:  Discharge Medication List as of 2018-01-24  6:07 AM       Note:  This document was prepared using Dragon voice recognition software and may include unintentional dictation errors.     Darel Hong, MD 01-24-18 0830

## 2018-02-04 NOTE — ED Notes (Addendum)
Date and time results received: 01/21/2018 0214 (use smartphrase ".now" to insert current time)  Test: lACTIC Critical Value: 6.8  Name of Provider Notified: Dr. Mable Paris  Orders Received? Or Actions Taken?: Provider aware

## 2018-02-04 NOTE — Progress Notes (Signed)
   02-03-18 0200  Clinical Encounter Type  Visited With Family;Patient and family together  Visit Type Initial;ED;Patient actively dying;Critical Care  Referral From Nurse  Spiritual Encounters  Spiritual Needs Emotional  [place] paged to offer support to family; [facility] offered emotional support to husband at bedside

## 2018-02-04 NOTE — ED Notes (Addendum)
Date and time results received: 02/13/18 0252 (use smartphrase ".now" to insert current time)  Test: Troponin Critical Value: 0.04  Name of Provider Notified: Dr. Mable Paris   Orders Received? Or Actions Taken?: Provider aware

## 2018-02-04 DEATH — deceased

## 2020-08-29 IMAGING — DX DG ABDOMEN 1V
1 series · 1 of 1 positions shown · non-contrast
Comparison: None.

CLINICAL DATA: Evaluate OG tube

EXAM:
ABDOMEN - 1 VIEW

[abdomen kub]
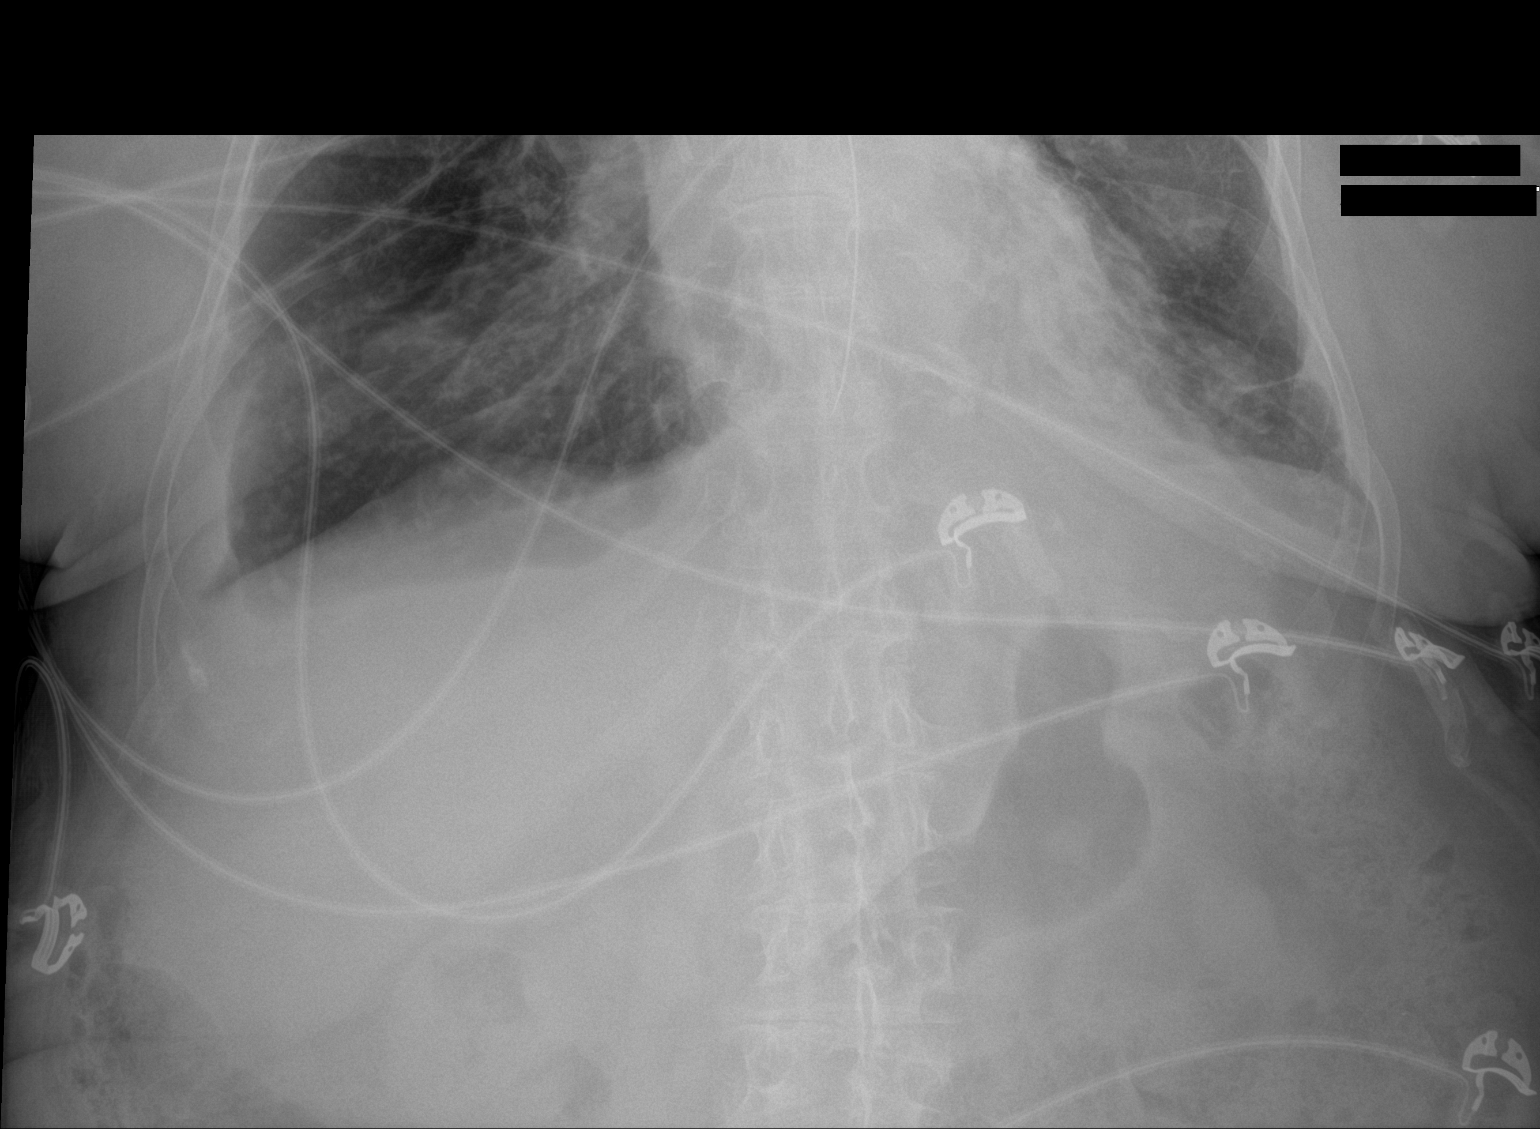

[1 of 1 positions shown; findings below may reference images not displayed]

FINDINGS: The OG tube distal tip is just above the GE junction. Recommend
advancement.
IMPRESSION: The OG tube distal tip is just above the GE junction. Recommend
advancement.
